# Patient Record
Sex: Male | Born: 1960 | Race: Black or African American | Hispanic: No | Marital: Single | Smoking: Never smoker
Health system: Southern US, Community
[De-identification: ages and names within clinical notes are randomized; demographics above are authoritative.]

## PROBLEM LIST (undated history)

## (undated) DIAGNOSIS — I499 Cardiac arrhythmia, unspecified: Secondary | ICD-10-CM

## (undated) DIAGNOSIS — R3129 Other microscopic hematuria: Secondary | ICD-10-CM

## (undated) DIAGNOSIS — N529 Male erectile dysfunction, unspecified: Secondary | ICD-10-CM

## (undated) DIAGNOSIS — I10 Essential (primary) hypertension: Secondary | ICD-10-CM

## (undated) DIAGNOSIS — M459 Ankylosing spondylitis of unspecified sites in spine: Secondary | ICD-10-CM

## (undated) HISTORY — DX: Ankylosing spondylitis of unspecified sites in spine: M45.9

## (undated) HISTORY — DX: Male erectile dysfunction, unspecified: N52.9

## (undated) HISTORY — DX: Other microscopic hematuria: R31.29

## (undated) HISTORY — DX: Essential (primary) hypertension: I10

---

## 1987-03-07 HISTORY — PX: LYMPH GLAND EXCISION: SHX13

## 2002-06-18 ENCOUNTER — Emergency Department (HOSPITAL_COMMUNITY): Admission: EM | Admit: 2002-06-18 | Discharge: 2002-06-18 | Payer: Self-pay | Admitting: Emergency Medicine

## 2003-04-21 ENCOUNTER — Encounter: Admission: RE | Admit: 2003-04-21 | Discharge: 2003-04-21 | Payer: Self-pay | Admitting: Family Medicine

## 2003-09-02 ENCOUNTER — Encounter: Admission: RE | Admit: 2003-09-02 | Discharge: 2003-09-02 | Payer: Self-pay | Admitting: Internal Medicine

## 2003-12-16 ENCOUNTER — Observation Stay (HOSPITAL_COMMUNITY): Admission: EM | Admit: 2003-12-16 | Discharge: 2003-12-16 | Payer: Self-pay | Admitting: Emergency Medicine

## 2005-10-26 ENCOUNTER — Ambulatory Visit: Payer: Self-pay | Admitting: Family Medicine

## 2005-11-27 ENCOUNTER — Ambulatory Visit: Payer: Self-pay | Admitting: Family Medicine

## 2006-04-02 ENCOUNTER — Ambulatory Visit: Payer: Self-pay | Admitting: Family Medicine

## 2006-08-08 ENCOUNTER — Ambulatory Visit: Payer: Self-pay | Admitting: Family Medicine

## 2007-03-20 ENCOUNTER — Ambulatory Visit: Payer: Self-pay | Admitting: Family Medicine

## 2007-04-19 ENCOUNTER — Ambulatory Visit: Payer: Self-pay | Admitting: Family Medicine

## 2007-09-05 ENCOUNTER — Ambulatory Visit: Payer: Self-pay | Admitting: Family Medicine

## 2007-09-12 ENCOUNTER — Ambulatory Visit: Payer: Self-pay | Admitting: Family Medicine

## 2009-04-22 ENCOUNTER — Ambulatory Visit: Payer: Self-pay | Admitting: Family Medicine

## 2010-01-31 ENCOUNTER — Ambulatory Visit: Payer: Self-pay | Admitting: Family Medicine

## 2010-05-19 ENCOUNTER — Ambulatory Visit (INDEPENDENT_AMBULATORY_CARE_PROVIDER_SITE_OTHER): Payer: BC Managed Care – PPO | Admitting: Family Medicine

## 2010-05-19 DIAGNOSIS — J209 Acute bronchitis, unspecified: Secondary | ICD-10-CM

## 2010-07-22 NOTE — H&P (Signed)
NAMEAADYN, Earl Sanchez                ACCOUNT NO.:  [id number]   MEDICAL RECORD NO.:  [id number]          PATIENT TYPE:  EMS   LOCATION:  MAJO                         FACILITY:  [facility]   PHYSICIAN:  Quita Skye. Collman, MDDATE OF BIRTH:  January 13, 1961   DATE OF ADMISSION:  12/15/2003  DATE OF DISCHARGE:                                HISTORY & PHYSICAL   HISTORY OF PRESENT ILLNESS:  The patient is a 50 year old black man who is  admitted to [facility] for further evaluation of chest pain.   The patient, who has no past history of cardiac disease, presented to the  emergency department after experiencing two episodes of chest pain this  evening.  The first episode lasted for one hour and resolved spontaneously.  The second episode lasted for approximately 10 minutes; it too resolved  spontaneously.  The episodes occurred as he was preparing to go to bed.  The  chest pain was described as an upper substernal pressure.  It radiated to  his jaw bilaterally.  There was no associated dyspnea, diaphoresis, or  nausea.  There were no exacerbating or ameliorating factors.  It appeared  not to be related to position, activity, meals, or respiration.  The patient  experienced a similar episode last week which also lasted an hour.  He  apparently came to the emergency department, but left before he was seen.  He had no other episodes of chest pain.  The patient is asymptomatic and  otherwise feels well at this time.   As noted, the patient had no past history of cardiac disease, including no  history of chest pain, myocardial infarction, coronary artery disease,  congestive heart failure, or arrhythmias.  He has a history of hypertension  and is on medications.  There is no history of diabetes mellitus,  dyslipidemia, smoking, or family history of early coronary artery disease.   In addition to the hypertension, the patient has no other medical problems  whatsoever.   PAST SURGICAL  HISTORY:  None.   PAST MEDICAL HISTORY:  Significant injuries; none.   MEDICATIONS:  Diltiazem.   ALLERGIES:  No known drug allergies.   SOCIAL HISTORY:  The patient lives with his wife.  He is employed in [employer].  He drinks rarely. He does not smoke.   REVIEW OF SYSTEMS:  No new problems related to his head, eyes, ears, nose,  mouth, throat, lungs, gastrointestinal system, genitourinary system, or  extremities.  There is no history of neurologic or psychiatric disorder.  There is no history of fever, chills, or weight loss.   PHYSICAL EXAMINATION:  VITAL SIGNS:  Blood pressure 180/99, pulse 89 and  regular, respirations 19, temperature 97.4.  GENERAL:  The patient is a middle-aged black man in no discomfort.  He was  alert, oriented, appropriate, and responsive.  HEENT:  Normal.  NECK:  Without thyromegaly or adenopathy.  Carotid pulses were palpable  bilaterally.  Bilateral carotid bruits were auscultated; the left bruit was  louder than the right.  HEART:  Normal S1 and S2.  There was no S3, S4,  murmur, rub, or click.  Cardiac rhythm is regular.  No chest wall tenderness was noted.  LUNGS:  Clear.  ABDOMEN:  Soft and nontender.  There was no mass, hepatosplenomegaly, bruit,  distention, rebound, guarding, or rigidity.  Bowel sounds were normal.  RECTAL:  GENITOURINARY:  Not performed as they were not pertinent to the  reason for acute care hospitalization.  EXTREMITIES:  Without edema, deviation, or deformity.  Radial and dorsalis  pedal pulses were palpable bilaterally.  Deep screening neurologic survey  was unremarkable.   The electrocardiogram revealed normal sinus rhythm.  There was left  ventricular hypertrophy with minimal secondary repolarization abnormalities.  There was a Q wave in V2 and minimal R wave in V3, suggesting a possible  prior anteroseptal myocardial infarction.  The chest radiograph, according  to the radiologist, demonstrated no evidence of acute  abnormalities.   LABORATORY DATA:  White count was 7.4 with a hemoglobin of 14.9 and  hematocrit of 42.6.  All other laboratory studies were pending at the time  of this dictation.   IMPRESSION:  1.  Unstable angina.  The patient's history is consistent with unstable      angina.  2.  Hypertension.  3.  Bilateral carotid bruits, louder on the left than on the right.   PLAN:  1.  Telemetry.  2.  Serial cardiac enzymes.  3.  Aspirin.  4.  Intravenous heparin.  5.  Intravenous nitroglycerin.  6.  Metoprolol.  7.  Fasting lipid profile.  8.  Carotid Dopplers.  9.  Further measures per Richard A. Alanda Amass, M.D.       MSC/MEDQ  D:  12/16/2003  T:  12/16/2003  Job:  [id number]   cc:   Gerlene Burdock A. Alanda Amass, M.D.  [phone] [address]  [place]  [place] [address]  Fax: [phone]

## 2010-07-22 NOTE — Cardiovascular Report (Signed)
NAMEBEAUMONT, AUSTAD                ACCOUNT NO.:  [id number]   MEDICAL RECORD NO.:  [id number]          PATIENT TYPE:  OBV   LOCATION:  6532                         FACILITY:  [facility]   PHYSICIAN:  Darlin Priestly, MD  DATE OF BIRTH:  [birth year]   DATE OF PROCEDURE:  12/16/2003  DATE OF DISCHARGE:                              CARDIAC CATHETERIZATION   PROCEDURES:  1.  Left heart catheterization.  2.  Coronary angiography.  3.  Left ventriculogram.  4.  Abdominal aortogram.   COMPLICATIONS:  None.   INDICATIONS:  Mr. Chanthavong is a 50 year old male admitted on December 15, 2003,  with recurrent chest pain on and off for one week.  The pain radiated to  neck and jaw.  He does have a history of hypertension, but no known coronary  disease.  He is now referred for cardiac catheterization secondary to  ongoing chest pain.   DESCRIPTION OF PROCEDURE:  After giving informed written consent, the  patient was brought to the cardiac catheterization lab.  Right groin was  shaved, prepped and draped in usual sterile fashion.  ECG moderately  established.  Using modified Seldinger technique, a #6 French arterial  sheath inserted in the right femoral artery.  A 6-French diagnostic catheter  was used to perform diagnostic angiography.   Left main is the large vessel with no significant disease.   The LAD is a large vessel which courses around the apex.  There were three  diagonal branches.  LAD had no significant disease.   First and second diagonal are small vessels.  No significant disease.   Third diagonal is a medium size vessel which bifurcates distally with no  significant disease.   Left coronary artery gives rise to a large ramus intermedius which  bifurcates distally.  There is no significant disease in the ramus.   Left circumflex is a large vessel which courses in the AV groove and gives  rise to two obtuse marginal branches.  There is no significant disease in  the AV groove  circumflex.  First and second OMs were medium sized vessels  with no significant disease.   The right coronary artery is a large vessel that is dominant and gives rise  to both PDA's and posterolateral branch.  There is no significant disease in  the RCA, PDA or posterolateral branch.   Left ventriculogram was super normal EF at 80% with near mid cavity  obliteration.   Abdominal aortogram revealed no evidence of renal artery stenosis.   HEMODYNAMIC DATA:  Systemic arterial pressure 145/91, LV pressure 152/7,  LVDP of 11.   CONCLUSION:  1.  No significant coronary artery disease.  2.  Normal left ventricular systolic function with near mid cavity      obliteration.  3.  No renal artery stenosis.  4.  Systemic hypertension.      Robe   RHM/MEDQ  D:  12/16/2003  T:  12/16/2003  Job:  [id number]

## 2010-08-10 ENCOUNTER — Other Ambulatory Visit: Payer: Self-pay | Admitting: Family Medicine

## 2010-08-10 NOTE — Telephone Encounter (Signed)
Please review refill req Cialis

## 2010-08-31 ENCOUNTER — Ambulatory Visit (INDEPENDENT_AMBULATORY_CARE_PROVIDER_SITE_OTHER): Payer: BC Managed Care – PPO | Admitting: Medical

## 2010-08-31 ENCOUNTER — Encounter: Payer: Self-pay | Admitting: Medical

## 2010-08-31 VITALS — BP 130/88 | HR 68 | Temp 98.1°F | Ht 68.0 in | Wt 163.0 lb

## 2010-08-31 DIAGNOSIS — A09 Infectious gastroenteritis and colitis, unspecified: Secondary | ICD-10-CM

## 2010-08-31 MED ORDER — CIPROFLOXACIN HCL 500 MG PO TABS: 500.0000 mg | ORAL_TABLET | Freq: Two times a day (BID) | ORAL | Status: AC

## 2010-08-31 NOTE — Progress Notes (Signed)
Subjective:   HPI  Earl Sanchez is a 50 y.o. male who presents for c/o illness. He notes for the past 2-3 weeks having intermittent abdominal pain, loose stools/diarrhea, 4+/wk, decreased appetite, flatulence, and nausea.  The diarrhea and abdominal pain worsen after eating.  The stool has been light colored.  He denies sick contacts with similar.  He has recently been out of the country in [place], and over [place] day weekend he had eaten at a cookout where the food had been left out at room temperature for several hours.  Otherwise he has been in his normal state of health.    Of note, he had sex with a new partner in [place] and used a condom, but the condom broke.  He has a physical coming up in 2 weeks, and wants to have STD testing then.  He has not current urinary or genital symptoms suggestive of STD and declines symptoms today.  The following portions of the patient's history were reviewed and updated as appropriate: allergies, current medications, past family history, past medical history, past social history, past surgical history and problem list.  Past Medical History  Diagnosis Date  . Hypertension   . Ankylosing spondylitis   . Microscopic hematuria   . Erectile dysfunction     Review of Systems Constitutional: +fatigue, anorexia, weight loss; denies fever, chills, sweats Dermatology: denies rash ENT: no runny nose, ear pain, sore throat, hoarseness, sinus pain Cardiology: denies chest pain, palpitations, edema Respiratory: denies cough, shortness of breath, wheezing Gastroenterology: denies constipation, blood in stool Hematology: denies bleeding or bruising problems Musculoskeletal: denies arthralgias, myalgias, joint swelling, back pain Urology: denies dysuria, difficulty urinating, hematuria, urinary frequency, urgency     Objective:   Physical Exam  General appearance: alert, no distress, WD/WN, black male Skin : warm, dry Oral cavity: MMM, no lesions Neck:  supple, no lymphadenopathy, no thyromegaly, no masses Heart: RRR, normal S1, S2, no murmurs Lungs: CTA bilaterally, no wheezes, rhonchi, or rales Abdomen: +bs, soft, mild left side tenderness, non distended, no masses, no hepatomegaly, no splenomegaly Back: non tender Musculoskeletal: nontender, no swelling, no obvious deformity Extremities: no edema, no cyanosis, no clubbing Pulses: 2+ symmetric, upper and lower extremities, normal cap refill   Assessment :    Encounter Diagnosis  Name Primary?  . Traveler's diarrhea Yes     Plan:    Discussed symptoms, and given the recent exposures including travel and possible food contamination, will treat with 3-5 days of Cipro.  Call report on Friday, and if worse or not improving, call or return.

## 2010-09-05 ENCOUNTER — Encounter: Payer: Self-pay | Admitting: Family Medicine

## 2010-09-06 ENCOUNTER — Telehealth: Payer: Self-pay | Admitting: *Deleted

## 2010-09-06 NOTE — Telephone Encounter (Addendum)
Message copied by Dorthula Perfect on Tue Sep 06, 2010  3:03 PM ------      Message from: Jac Canavan      Created: Tue Sep 06, 2010  1:54 PM       msg called in that he is requesting refill on diarrhea medication.  I assume he means the Cipro?  If this is correct, what are his symptoms?   I had told him that if the symptoms and diarrhea didn't resolve, then we should check some labs including urine, CBC, stool studies, CMP, etc.  See what symptoms he is having?  Called pt and still having same symptoms but not as bad.  Would like rx for Cipro.  Antibiotic seems to be working really well.  What would you like to do?  CM, LPN   Called in Cipro and informed pt that rx was sent. Pt has an appointment on 09-12-10 with Dr. Susann Givens.  CM, LPN

## 2010-09-06 NOTE — Telephone Encounter (Signed)
Called in Cipro 500 mg x 5 days #10 with no refills to [facility] at [phone].  Pt aware.  CM, LPN

## 2010-09-06 NOTE — Telephone Encounter (Signed)
Returned call to pt and would like rx for Cipro.  Still has same symptoms but not as bad.  Pt is scheduled to come in on 09-12-10 to see Dr. Susann Givens.  CM,LPN

## 2010-09-06 NOTE — Telephone Encounter (Deleted)
Message copied by Dorthula Perfect on Tue Sep 06, 2010  3:02 PM ------      Message from: Jac Canavan      Created: Tue Sep 06, 2010  1:54 PM       msg called in that he is requesting refill on diarrhea medication.  I assume he means the Cipro?  If this is correct, what are his symptoms?   I had told him that if the symptoms and diarrhea didn't resolve, then we should check some labs including urine, CBC, stool studies, CMP, etc.  See what symptoms he is having?

## 2010-09-06 NOTE — Telephone Encounter (Signed)
Call out Cipro 500mg  BID x 5 days #10, no refill.  If this doesn't resolve things, then come in for recheck.

## 2010-09-12 ENCOUNTER — Ambulatory Visit (INDEPENDENT_AMBULATORY_CARE_PROVIDER_SITE_OTHER): Payer: BC Managed Care – PPO | Admitting: Family Medicine

## 2010-09-12 ENCOUNTER — Encounter: Payer: Self-pay | Admitting: Family Medicine

## 2010-09-12 VITALS — BP 132/88 | HR 68 | Ht 68.0 in | Wt 161.0 lb

## 2010-09-12 DIAGNOSIS — Z0000 Encounter for general adult medical examination without abnormal findings: Secondary | ICD-10-CM

## 2010-09-12 DIAGNOSIS — M459 Ankylosing spondylitis of unspecified sites in spine: Secondary | ICD-10-CM

## 2010-09-12 DIAGNOSIS — R319 Hematuria, unspecified: Secondary | ICD-10-CM | POA: Insufficient documentation

## 2010-09-12 DIAGNOSIS — H20029 Recurrent acute iridocyclitis, unspecified eye: Secondary | ICD-10-CM

## 2010-09-12 DIAGNOSIS — Z209 Contact with and (suspected) exposure to unspecified communicable disease: Secondary | ICD-10-CM

## 2010-09-12 DIAGNOSIS — I10 Essential (primary) hypertension: Secondary | ICD-10-CM

## 2010-09-12 DIAGNOSIS — N529 Male erectile dysfunction, unspecified: Secondary | ICD-10-CM

## 2010-09-12 LAB — CBC WITH DIFFERENTIAL/PLATELET
Basophils Absolute: 0 10*3/uL (ref 0.0–0.1)
Basophils Relative: 0 % (ref 0–1)
Eosinophils Absolute: 0.1 10*3/uL (ref 0.0–0.7)
Eosinophils Relative: 1 % (ref 0–5)
HCT: 45.5 % (ref 39.0–52.0)
Hemoglobin: 15.3 g/dL (ref 13.0–17.0)
Lymphocytes Relative: 35 % (ref 12–46)
Lymphs Abs: 1.7 10*3/uL (ref 0.7–4.0)
MCH: 29.3 pg (ref 26.0–34.0)
MCHC: 33.6 g/dL (ref 30.0–36.0)
MCV: 87 fL (ref 78.0–100.0)
Monocytes Absolute: 0.5 10*3/uL (ref 0.1–1.0)
Monocytes Relative: 10 % (ref 3–12)
Neutro Abs: 2.7 10*3/uL (ref 1.7–7.7)
Neutrophils Relative %: 54 % (ref 43–77)
Platelets: 281 10*3/uL (ref 150–400)
RBC: 5.23 MIL/uL (ref 4.22–5.81)
RDW: 14.9 % (ref 11.5–15.5)
WBC: 5 10*3/uL (ref 4.0–10.5)

## 2010-09-12 LAB — LIPID PANEL
Cholesterol: 199 mg/dL (ref 0–200)
HDL: 77 mg/dL (ref >39)
LDL Cholesterol: 109 mg/dL — ABNORMAL HIGH (ref 0–99)
Total CHOL/HDL Ratio: 2.6 Ratio
Triglycerides: 66 mg/dL (ref <150)
VLDL: 13 mg/dL (ref 0–40)

## 2010-09-12 LAB — COMPREHENSIVE METABOLIC PANEL
ALT: 16 U/L (ref 0–53)
AST: 26 U/L (ref 0–37)
Albumin: 4.7 g/dL (ref 3.5–5.2)
Alkaline Phosphatase: 84 U/L (ref 39–117)
BUN: 9 mg/dL (ref 6–23)
CO2: 24 mEq/L (ref 19–32)
Calcium: 9.7 mg/dL (ref 8.4–10.5)
Chloride: 106 mEq/L (ref 96–112)
Creat: 0.93 mg/dL (ref 0.50–1.35)
Glucose, Bld: 94 mg/dL (ref 70–99)
Potassium: 4.1 mEq/L (ref 3.5–5.3)
Sodium: 142 mEq/L (ref 135–145)
Total Bilirubin: 0.8 mg/dL (ref 0.3–1.2)
Total Protein: 7.6 g/dL (ref 6.0–8.3)

## 2010-09-12 LAB — RPR

## 2010-09-12 NOTE — Progress Notes (Signed)
  Subjective:    Patient ID: Earl Sanchez, male    DOB: 1960-05-06, 50 y.o.   MRN: [id number]  HPI He is here for a complete examination. He has had intermittent difficulty with iritis and is seeing an ophthalmologist for this. His abdominal distress is much better. He did have a recent sexual encounter and the condom broke. He is having no discharge or dysuria. His review of systems is essentially negative. He does drink but does not smoke. He is sexually active as mentioned above with women. Family history is unchanged.   Review of Systems  Constitutional: Negative.   HENT: Negative.   Eyes: Positive for pain.  Respiratory: Negative.   Cardiovascular: Negative.   Gastrointestinal: Negative.   Musculoskeletal: Negative.   Skin: Negative.        Objective:   Physical Exam BP 132/88  Pulse 68  Ht 5\' 8"  (1.727 m)  Wt 161 lb (73.029 kg)  BMI 24.48 kg/m2  General Appearance:    Alert, cooperative, no distress, appears stated age  Head:    Normocephalic, without obvious abnormality, atraumatic  Eyes:    PERRL, conjunctiva/corneas clear, EOM's intact, fundi    benign  Ears:    Normal TM's and external ear canals  Nose:   Nares normal, mucosa normal, no drainage or sinus   tenderness  Throat:   Lips, mucosa, and tongue normal; teeth and gums normal  Neck:   Supple, no lymphadenopathy;  thyroid:  no   enlargement/tenderness/nodules; no carotid   bruit or JVD  Back:    Spine nontender, no curvature, ROM normal, no CVA     tenderness  Lungs:     Clear to auscultation bilaterally without wheezes, rales or     ronchi; respirations unlabored  Chest Wall:    No tenderness or deformity   Heart:    Regular rate and rhythm, S1 and S2 normal, no murmur, rub   or gallop  Breast Exam:    No chest wall tenderness, masses or gynecomastia  Abdomen:     Soft, non-tender, nondistended, normoactive bowel sounds,    no masses, no hepatosplenomegaly  Genitalia:    Normal male external genitalia  without lesions.  Testicles without masses.  No inguinal hernias.      Extremities:   No clubbing, cyanosis or edema  Pulses:   2+ and symmetric all extremities  Skin:   Skin color, texture, turgor normal, no rashes or lesions  Lymph nodes:   Cervical, supraclavicular, and axillary nodes normal  Neurologic:   CNII-XII intact, normal strength, sensation and gait; reflexes 2+ and symmetric throughout          Psych:   Normal mood, affect, hygiene and grooming.           Assessment & Plan:   Routine blood screening including HIV, RPR as well as GC Chlamydia. He will be set up for colonoscopy.

## 2010-09-12 NOTE — Patient Instructions (Signed)
I will call you tomorrow with the results of the blood work 

## 2010-09-13 LAB — GC/CHLAMYDIA PROBE AMP, GENITAL
Chlamydia, DNA Probe: NEGATIVE
GC Probe Amp, Genital: NEGATIVE

## 2010-09-13 LAB — HIV ANTIBODY (ROUTINE TESTING W REFLEX): HIV: NONREACTIVE

## 2010-09-14 ENCOUNTER — Telehealth: Payer: Self-pay

## 2010-09-14 NOTE — Telephone Encounter (Signed)
Called pt to let him know labs normal left message labs were normal any ? He can give me a call

## 2010-10-25 ENCOUNTER — Other Ambulatory Visit: Payer: Self-pay | Admitting: Family Medicine

## 2011-02-08 ENCOUNTER — Encounter (HOSPITAL_COMMUNITY): Payer: Self-pay | Admitting: Emergency Medicine

## 2011-02-08 ENCOUNTER — Emergency Department (HOSPITAL_COMMUNITY)
Admission: EM | Admit: 2011-02-08 | Discharge: 2011-02-09 | Discharge status: Against Medical Advice | Payer: BC Managed Care – PPO | Attending: Emergency Medicine | Admitting: Emergency Medicine

## 2011-02-08 DIAGNOSIS — R319 Hematuria, unspecified: Secondary | ICD-10-CM | POA: Insufficient documentation

## 2011-02-08 LAB — URINALYSIS, ROUTINE W REFLEX MICROSCOPIC
Bilirubin Urine: NEGATIVE
Glucose, UA: NEGATIVE mg/dL
Ketones, ur: NEGATIVE mg/dL
Leukocytes, UA: NEGATIVE
Nitrite: NEGATIVE
Protein, ur: NEGATIVE mg/dL
Specific Gravity, Urine: 1.006 (ref 1.005–1.030)
Urobilinogen, UA: 0.2 mg/dL (ref 0.0–1.0)
pH: 6 (ref 5.0–8.0)

## 2011-02-08 LAB — URINE MICROSCOPIC-ADD ON

## 2011-02-08 NOTE — ED Notes (Signed)
Pt c/o hematuria and passing clots in urine, onset earlier today with no complaints of pain

## 2011-02-08 NOTE — ED Notes (Signed)
Call to waiting area no reply at this time. 

## 2011-02-09 ENCOUNTER — Telehealth: Payer: Self-pay | Admitting: Family Medicine

## 2011-02-09 NOTE — Telephone Encounter (Signed)
[facility] RE ER VISIT FOLLOW UP   DT

## 2011-02-20 ENCOUNTER — Encounter: Payer: Self-pay | Admitting: Family Medicine

## 2011-02-20 ENCOUNTER — Ambulatory Visit (INDEPENDENT_AMBULATORY_CARE_PROVIDER_SITE_OTHER): Payer: BC Managed Care – PPO | Admitting: Family Medicine

## 2011-02-20 DIAGNOSIS — R319 Hematuria, unspecified: Secondary | ICD-10-CM

## 2011-02-20 DIAGNOSIS — Z23 Encounter for immunization: Secondary | ICD-10-CM

## 2011-02-20 DIAGNOSIS — N419 Inflammatory disease of prostate, unspecified: Secondary | ICD-10-CM

## 2011-02-20 DIAGNOSIS — Z1211 Encounter for screening for malignant neoplasm of colon: Secondary | ICD-10-CM

## 2011-02-20 LAB — POCT URINALYSIS DIPSTICK
Bilirubin, UA: NEGATIVE
Blood, UA: 250
Glucose, UA: NEGATIVE
Ketones, UA: NEGATIVE
Leukocytes, UA: NEGATIVE
Nitrite, UA: NEGATIVE
Protein, UA: NEGATIVE
Spec Grav, UA: 1.025
Urobilinogen, UA: NEGATIVE
pH, UA: 7.5

## 2011-02-20 LAB — POC HEMOCCULT BLD/STL (OFFICE/1-CARD/DIAGNOSTIC): Fecal Occult Blood, POC: NEGATIVE

## 2011-02-20 MED ORDER — CIPROFLOXACIN HCL 500 MG PO TABS: 500.0000 mg | ORAL_TABLET | Freq: Two times a day (BID) | ORAL | Status: AC

## 2011-02-20 NOTE — Patient Instructions (Signed)
Take all the antibiotic and  I will see you back here in 2 weeks

## 2011-02-20 NOTE — Progress Notes (Signed)
  Subjective:    Patient ID: Earl Sanchez, male    DOB: 10-22-1960, 50 y.o.   MRN: [id number]  HPI He notes difficulty with blood in his urine mainly with erections and with ejaculation. He has had no burning, stinging or discharge.   Review of Systems     Objective:   Physical Exam        Assessment & Plan:

## 2011-03-08 ENCOUNTER — Encounter: Payer: Self-pay | Admitting: Family Medicine

## 2011-03-08 ENCOUNTER — Ambulatory Visit (INDEPENDENT_AMBULATORY_CARE_PROVIDER_SITE_OTHER): Payer: BC Managed Care – PPO | Admitting: Family Medicine

## 2011-03-08 DIAGNOSIS — R319 Hematuria, unspecified: Secondary | ICD-10-CM

## 2011-03-08 LAB — POCT URINALYSIS DIPSTICK
Bilirubin, UA: NEGATIVE
Blood, UA: 50
Glucose, UA: NEGATIVE
Ketones, UA: NEGATIVE
Leukocytes, UA: NEGATIVE
Spec Grav, UA: 1.02
Urobilinogen, UA: NEGATIVE

## 2011-03-08 NOTE — Patient Instructions (Signed)
If you have difficulty with bleeding again, back in for repeat urine check and possible referral

## 2011-03-08 NOTE — Progress Notes (Signed)
  Subjective:    Patient ID: Earl Sanchez, male    DOB: Feb 15, 1961, 51 y.o.   MRN: [id number]  HPI He is here for reevaluation of seeing blood in his semen. Since his last visit he has had 2 episodes of seeing well otherwise he has had no trouble.   Review of Systems     Objective:   Physical Exam Alert and in no distress. Urine microscopic was negative      Assessment & Plan:  Hematuria probably secondary to prostatitis. No further therapy however if he has another episode, possible referral will be made.

## 2011-05-15 ENCOUNTER — Encounter: Payer: Self-pay | Admitting: Family Medicine

## 2011-05-15 ENCOUNTER — Encounter: Payer: Self-pay | Admitting: Gastroenterology

## 2011-05-15 ENCOUNTER — Ambulatory Visit (INDEPENDENT_AMBULATORY_CARE_PROVIDER_SITE_OTHER): Payer: BC Managed Care – PPO | Admitting: Family Medicine

## 2011-05-15 VITALS — BP 132/90 | HR 72 | Wt 168.0 lb

## 2011-05-15 DIAGNOSIS — R195 Other fecal abnormalities: Secondary | ICD-10-CM

## 2011-05-15 DIAGNOSIS — N410 Acute prostatitis: Secondary | ICD-10-CM

## 2011-05-15 DIAGNOSIS — N433 Hydrocele, unspecified: Secondary | ICD-10-CM

## 2011-05-15 DIAGNOSIS — Z209 Contact with and (suspected) exposure to unspecified communicable disease: Secondary | ICD-10-CM

## 2011-05-15 DIAGNOSIS — R350 Frequency of micturition: Secondary | ICD-10-CM

## 2011-05-15 DIAGNOSIS — N342 Other urethritis: Secondary | ICD-10-CM

## 2011-05-15 LAB — POCT URINALYSIS DIPSTICK
Bilirubin, UA: NEGATIVE
Glucose, UA: NEGATIVE
Ketones, UA: NEGATIVE
Leukocytes, UA: NEGATIVE
Nitrite, UA: NEGATIVE
Protein, UA: NEGATIVE
Spec Grav, UA: 1.01
Urobilinogen, UA: NEGATIVE
pH, UA: 6

## 2011-05-15 LAB — HEMOCCULT GUIAC POC 1CARD (OFFICE)

## 2011-05-15 MED ORDER — DOXYCYCLINE HYCLATE 100 MG PO TABS: 100.0000 mg | ORAL_TABLET | Freq: Two times a day (BID) | ORAL | Status: DC

## 2011-05-15 NOTE — Patient Instructions (Signed)
If you have continued difficulty with blood in your urine, you're to return here for

## 2011-05-15 NOTE — Progress Notes (Signed)
  Subjective:    Patient ID: Earl Sanchez, male    DOB: [birth year], 51 y.o.   MRN: [id number]  HPI Approximately 5 days ago he noted the onset of burning on urination but no discharge and difficulty with urgency. Approximately one week prior to that he did have sex and the condom broke. He also notes continued difficulty with right-sided hydrocele that he says is slightly larger.   Review of Systems     Objective:   Physical Exam Genital exam does show a 2 cm transilluminating lesion proximal to the right testes. No other lesions are noted. No discharge noted. Rectal exam shows a slightly boggy prostate. Goal is guaiac-positive Urine microscopic was negative.       Assessment & Plan:   1. Urination frequency  POCT Urinalysis Dipstick  2. Hydrocele, right    3. Stool guaiac positive  HM COLONOSCOPY, Hemoccult - 1 Card (office)  4. Prostatitis, acute  doxycycline (VIBRA-TABS) 100 MG tablet  5. Urethritis  doxycycline (VIBRA-TABS) 100 MG tablet  6. Contact with or exposure to unspecified communicable disease  GC/chlamydia probe amp, urine   I will treat him with doxycycline. No therapy needed for the hydrocele since it is not really causing any difficulty. Will also be set up for colonoscopy. This was supposed up and set up in the past however apparently he failed to followup on this.

## 2011-05-16 LAB — GC/CHLAMYDIA PROBE AMP, URINE
Chlamydia, Swab/Urine, PCR: NEGATIVE
GC Probe Amp, Urine: NEGATIVE

## 2011-05-22 ENCOUNTER — Ambulatory Visit (AMBULATORY_SURGERY_CENTER): Payer: BC Managed Care – PPO | Admitting: *Deleted

## 2011-05-22 ENCOUNTER — Encounter: Payer: Self-pay | Admitting: Gastroenterology

## 2011-05-22 VITALS — Ht 68.0 in | Wt 168.0 lb

## 2011-05-22 DIAGNOSIS — K921 Melena: Secondary | ICD-10-CM

## 2011-05-22 DIAGNOSIS — Z1211 Encounter for screening for malignant neoplasm of colon: Secondary | ICD-10-CM

## 2011-05-22 MED ORDER — PEG-KCL-NACL-NASULF-NA ASC-C 100 G PO SOLR: ORAL | Status: DC

## 2011-06-01 ENCOUNTER — Other Ambulatory Visit: Payer: Self-pay | Admitting: Family Medicine

## 2011-06-01 ENCOUNTER — Telehealth: Payer: Self-pay

## 2011-06-01 MED ORDER — CIPROFLOXACIN HCL 500 MG PO TABS: 500.0000 mg | ORAL_TABLET | Freq: Two times a day (BID) | ORAL | Status: AC

## 2011-06-01 NOTE — Telephone Encounter (Signed)
Called patient to let him know that Dr.Knapp ok'd cipro and it was sent to his pharmacy. He is to follow up next week if not better.

## 2011-06-01 NOTE — Telephone Encounter (Signed)
Pt called and said he can't come in today and said he finished the Doxy and is still having same problem with the burning with urination ask if we could call in some Cipro for him please advise

## 2011-06-01 NOTE — Telephone Encounter (Signed)
Okay for cipro, treating for possible prostatitis (not responsive to doxycycline)--advise him rx was sent to pharmacy.  F/u next week if not better.

## 2011-06-05 ENCOUNTER — Encounter: Payer: Self-pay | Admitting: Gastroenterology

## 2011-06-05 ENCOUNTER — Ambulatory Visit (AMBULATORY_SURGERY_CENTER): Payer: BC Managed Care – PPO | Admitting: Gastroenterology

## 2011-06-05 VITALS — BP 155/92 | HR 67 | Temp 97.6°F | Resp 20 | Ht 68.0 in | Wt 168.0 lb

## 2011-06-05 DIAGNOSIS — Z1211 Encounter for screening for malignant neoplasm of colon: Secondary | ICD-10-CM

## 2011-06-05 DIAGNOSIS — K5730 Diverticulosis of large intestine without perforation or abscess without bleeding: Secondary | ICD-10-CM

## 2011-06-05 DIAGNOSIS — D126 Benign neoplasm of colon, unspecified: Secondary | ICD-10-CM

## 2011-06-05 MED ORDER — SODIUM CHLORIDE 0.9 % IV SOLN: 500.0000 mL | INTRAVENOUS | Status: DC

## 2011-06-05 NOTE — Patient Instructions (Addendum)
YOU HAD AN ENDOSCOPIC PROCEDURE TODAY AT THE [facility]: Refer to the procedure report that was given to you for any specific questions about what was found during the examination.  If the procedure report does not answer your questions, please call your gastroenterologist to clarify.  If you requested that your care partner not be given the details of your procedure findings, then the procedure report has been included in a sealed envelope for you to review at your convenience later.  YOU SHOULD EXPECT: Some feelings of bloating in the abdomen. Passage of more gas than usual.  Walking can help get rid of the air that was put into your GI tract during the procedure and reduce the bloating. If you had a lower endoscopy (such as a colonoscopy or flexible sigmoidoscopy) you may notice spotting of blood in your stool or on the toilet paper. If you underwent a bowel prep for your procedure, then you may not have a normal bowel movement for a few days.  DIET: Your first meal following the procedure should be a light meal and then it is ok to progress to your normal diet.  A half-sandwich or bowl of soup is an example of a good first meal.  Heavy or fried foods are harder to digest and may make you feel nauseous or bloated.  Likewise meals heavy in dairy and vegetables can cause extra gas to form and this can also increase the bloating.  Drink plenty of fluids but you should avoid alcoholic beverages for 24 hours.  ACTIVITY: Your care partner should take you home directly after the procedure.  You should plan to take it easy, moving slowly for the rest of the day.  You can resume normal activity the day after the procedure however you should NOT DRIVE or use heavy machinery for 24 hours (because of the sedation medicines used during the test).    SYMPTOMS TO REPORT IMMEDIATELY: A gastroenterologist can be reached at any hour.  During normal business hours, 8:30 AM to 5:00 PM Monday through Friday,  call [phone].  After hours and on weekends, please call the GI answering service at [phone] who will take a message and have the physician on call contact you.   Following lower endoscopy (colonoscopy or flexible sigmoidoscopy):  Excessive amounts of blood in the stool  Significant tenderness or worsening of abdominal pains  Swelling of the abdomen that is new, acute  Fever of 100F or higher    FOLLOW UP: If any biopsies were taken you will be contacted by phone or by letter within the next 1-3 weeks.  Call your gastroenterologist if you have not heard about the biopsies in 3 weeks.  Our staff will call the home number listed on your records the next business day following your procedure to check on you and address any questions or concerns that you may have at that time regarding the information given to you following your procedure. This is a courtesy call and so if there is no answer at the home number and we have not heard from you through the emergency physician on call, we will assume that you have returned to your regular daily activities without incident.  SIGNATURES/CONFIDENTIALITY: You and/or your care partner have signed paperwork which will be entered into your electronic medical record.  These signatures attest to the fact that that the information above on your After Visit Summary has been reviewed and is understood.  Full responsibility of the confidentiality   of this discharge information lies with you and/or your care-partner.   INFORMATION ON POLYPS, DIVERTICULOSIS , & HIGH FIBER DIET GIVEN TO YOU TODAY.   NO ASPIRIN OR ANTI INFLAMMATORY MEDICATIONS X 2 WEEKS

## 2011-06-05 NOTE — Progress Notes (Signed)
Patient did not experience any of the following events: a burn prior to discharge; a fall within the facility; wrong site/side/patient/procedure/implant event; or a hospital transfer or hospital admission upon discharge from the facility. (G8907) Patient did not have preoperative order for IV antibiotic SSI prophylaxis. (G8918)  

## 2011-06-05 NOTE — Op Note (Signed)
[facility] [address] [place], [place]  [address]  COLONOSCOPY PROCEDURE REPORT  PATIENT:  Earl, Sanchez  MR#:  [id number] BIRTHDATE:  March 27, 1960, 50 yrs. old  GENDER:  male ENDOSCOPIST:  Vania Rea. Jarold Motto, MD, [facility] REF. BY: PROCEDURE DATE:  06/05/2011 PROCEDURE:  Colonoscopy with snare polypectomy ASA CLASS:  Class II INDICATIONS:  Colorectal cancer screening, average risk, FOBT positive stool MEDICATIONS:   propofol (Diprivan) 350 mg IV  DESCRIPTION OF PROCEDURE:   After the risks and benefits and of the procedure were explained, informed consent was obtained. Digital rectal exam was performed and revealed no abnormalities. The LB 180AL [id number] endoscope was introduced through the anus and advanced to the cecum, which was identified by both the appendix and ileocecal valve.  The quality of the prep was excellent, using MoviPrep.  The instrument was then slowly withdrawn as the colon was fully examined. <<PROCEDUREIMAGES>>  FINDINGS:  There were mild diverticular changes in left colon. diverticulosis was found.  A sessile polyp was found in the sigmoid colon. 5 MM VASCULAR SIGMOID POLYP HOT SNARE EXCISED AND TISSUE TO LAB.  This was otherwise a normal examination of the colon.   Retroflexed views in the rectum revealed no abnormalities.    The scope was then withdrawn from the patient and the procedure completed.  COMPLICATIONS:  None ENDOSCOPIC IMPRESSION: 1) Diverticulosis,mild,left sided diverticulosis 2) Sessile polyp in the sigmoid colon 3) Otherwise normal examination R/O ADENOMA. RECOMMENDATIONS: 1) Repeat colonoscopy in 5 years if polyp adenomatous; otherwise 10 years 2) High fiber diet.  REPEAT EXAM:  No  ______________________________ Vania Rea. Jarold Motto, MD, [facility]  CC:  n. eSIGNED:   Vania Rea. Makenleigh Crownover at 06/05/2011 02:39 PM  Allayne Stack, [phone]

## 2011-06-06 ENCOUNTER — Telehealth: Payer: Self-pay

## 2011-06-06 NOTE — Telephone Encounter (Signed)
  Follow up Call-  Call back number 06/05/2011  Post procedure Call Back phone  # [phone]  Permission to leave phone message Yes     Patient questions:  Do you have a fever, pain , or abdominal swelling? no Pain Score  0 *  Have you tolerated food without any problems? yes  Have you been able to return to your normal activities? yes  Do you have any questions about your discharge instructions: Diet   no Medications  no Follow up visit  no  Do you have questions or concerns about your Care? no  Actions: * If pain score is 4 or above: No action needed, pain <4.  Per the pt" everything went well". Maw

## 2011-06-09 ENCOUNTER — Encounter: Payer: Self-pay | Admitting: Gastroenterology

## 2011-08-16 ENCOUNTER — Encounter: Payer: Self-pay | Admitting: Medical

## 2011-08-16 ENCOUNTER — Ambulatory Visit (INDEPENDENT_AMBULATORY_CARE_PROVIDER_SITE_OTHER): Payer: BC Managed Care – PPO | Admitting: Medical

## 2011-08-16 VITALS — BP 130/82 | HR 80 | Temp 99.4°F | Resp 16 | Wt 172.0 lb

## 2011-08-16 DIAGNOSIS — R630 Anorexia: Secondary | ICD-10-CM

## 2011-08-16 DIAGNOSIS — R5383 Other fatigue: Secondary | ICD-10-CM

## 2011-08-16 DIAGNOSIS — N411 Chronic prostatitis: Secondary | ICD-10-CM

## 2011-08-16 DIAGNOSIS — M545 Low back pain, unspecified: Secondary | ICD-10-CM

## 2011-08-16 DIAGNOSIS — R110 Nausea: Secondary | ICD-10-CM

## 2011-08-16 DIAGNOSIS — R5381 Other malaise: Secondary | ICD-10-CM

## 2011-08-16 LAB — POCT URINALYSIS DIPSTICK
Glucose, UA: NEGATIVE
Ketones, UA: NEGATIVE
Nitrite, UA: NEGATIVE
pH, UA: 5

## 2011-08-16 MED ORDER — NAPROXEN SODIUM ER 500 MG PO TB24: 500.0000 mg | ORAL_TABLET | Freq: Every day | ORAL | Status: DC

## 2011-08-16 MED ORDER — CIPROFLOXACIN HCL 500 MG PO TABS: 500.0000 mg | ORAL_TABLET | Freq: Two times a day (BID) | ORAL | Status: AC

## 2011-08-16 NOTE — Progress Notes (Signed)
Subjective:   HPI  Earl Sanchez is a 51 y.o. male who presents today with 1.5 week hx/o low back pain.  He notes low energy, no appetite, bubbly stomach and pain in his eye sockets.   Back pain is low and midline, no radiation.  He denies bowel or bladder issues.  No abdominal pain but does have discomfort in back of scrotum.  No urine stream changes, no ejaculate changes.  No numbness, no tingling, no weakness.  Barely can get out of the bed in the morning, can get up due to 10/10 pain.   Having difficulty with forward flexion of the back, similar to back pain prior with onset of ankylosing spondylitis in remote past.  Very stiff and limited ROM.  No other aggravating or relieving factors.  He works as a Pensions consultant.  He was seen here in April, had STD screening and prostate was a little enlarged.  No other c/o.  The following portions of the patient's history were reviewed and updated as appropriate: allergies, current medications, past family history, past medical history, past social history, past surgical history and problem list.  Past Medical History  Diagnosis Date  . Hypertension   . Ankylosing spondylitis   . Microscopic hematuria   . Erectile dysfunction     No Known Allergies   Review of Systems Gen: fatigue, no appetite, no fever, chills, sweats GI: nausea, abdomen feels bubbly and strange. No flatulence or belching.  No constipation, diarrhea, blood in stool GU: negative MSK: negative Neuro: negative ROS reviewed and was negative other than noted in HPI or above.    Objective:   Physical Exam  General appearance: alert, no distress, WD/WN, black male Oral cavity: MMM, no lesions Neck: supple, no lymphadenopathy, no thyromegaly, no masses Heart: RRR, normal S1, S2, no murmurs Lungs: CTA bilaterally, no wheezes, rhonchi, or rales Abdomen: +bs, soft, non tender, non distended, no masses, no hepatomegaly, no splenomegaly Back: nontender but limited ROM due to pain, no  scoliosis GU: nontender, normal male genitalia, no mass, no hernia or lymphadenopathy Pulses: 2+ symmetric Rectal: deferred   Assessment and Plan :     Encounter Diagnoses  Name Primary?  . Pain in lower back Yes  . Chronic prostatitis   . Nausea   . Malaise   . Anorexia    Pain in low back - possible related to prostatitis but more likely a flare up of the ankylosing spondylitis.  Samples of Naprelan given, 1000mg  daily x 2 days, then 750mg  daily for 3 days, then can use OTC NSAID prn  His other symptoms and abnormal urinalysis suggests prostatitis.  reviewed prior records, he has had hematuria, and he was treated for the possible prostatitis vs other infection a few months ago, had negative STD screening at that time.  We will begin Cipro for 30 days and recheck.  Urine culture sent.

## 2011-08-17 ENCOUNTER — Encounter: Payer: Self-pay | Admitting: Medical

## 2011-08-18 ENCOUNTER — Ambulatory Visit (INDEPENDENT_AMBULATORY_CARE_PROVIDER_SITE_OTHER): Payer: BC Managed Care – PPO | Admitting: Family Medicine

## 2011-08-18 ENCOUNTER — Other Ambulatory Visit: Payer: Self-pay | Admitting: Family Medicine

## 2011-08-18 VITALS — BP 130/80 | HR 77 | Temp 98.8°F | Wt 164.0 lb

## 2011-08-18 DIAGNOSIS — R509 Fever, unspecified: Secondary | ICD-10-CM

## 2011-08-18 DIAGNOSIS — R141 Gas pain: Secondary | ICD-10-CM

## 2011-08-18 DIAGNOSIS — R140 Abdominal distension (gaseous): Secondary | ICD-10-CM

## 2011-08-18 LAB — URINE CULTURE: Organism ID, Bacteria: NO GROWTH

## 2011-08-18 NOTE — Patient Instructions (Signed)
Use the Dexilant every day. If you have difficulty over the weekend call me and I will help you taking care of in the ER.

## 2011-08-18 NOTE — Progress Notes (Signed)
  Subjective:    Patient ID: Earl Sanchez, male    DOB: Oct 12, 1960, 51 y.o.   MRN: [id number]  HPI He has a ten-day history of fever, chills, abdominal bloating with occasional vomiting but no diarrhea. He was in [place] or approximately one month ago had no difficulty while he was there. He is also had a flare of his ankylosing spondylitis with back pain that did respond to Naprelan. He also has a documented weight loss from 172 down to 164  Review of Systems     Objective:   Physical Exam alert and in no distress. Tympanic membranes and canals are normal. Throat is clear. Tonsils are normal. Neck is supple without adenopathy or thyromegaly. Cardiac exam shows a regular sinus rhythm without murmurs or gallops. Lungs are clear to auscultation.        Assessment & Plan:   1. Fever and chills  CBC with Differential, Comprehensive metabolic panel  2. Abdominal bloating  CBC with Differential, Comprehensive metabolic panel   the etiology of this is unclear but his trip to [place] certainly makes me wonder about an infectious etiology. A sample of Dexilant given to see if this will help with his abdominal distress. Encouraged him to call me over the weekend if he has further difficulty

## 2011-08-19 LAB — COMPREHENSIVE METABOLIC PANEL
ALT: 5025 U/L — ABNORMAL HIGH (ref 0–53)
AST: 4953 U/L — ABNORMAL HIGH (ref 0–37)
Albumin: 4 g/dL (ref 3.5–5.2)
CO2: 28 mEq/L (ref 19–32)
Calcium: 8.9 mg/dL (ref 8.4–10.5)
Chloride: 101 mEq/L (ref 96–112)
Potassium: 4.2 mEq/L (ref 3.5–5.3)
Sodium: 135 mEq/L (ref 135–145)
Total Protein: 7.1 g/dL (ref 6.0–8.3)

## 2011-08-19 LAB — CBC WITH DIFFERENTIAL/PLATELET
Basophils Absolute: 0.1 10*3/uL (ref 0.0–0.1)
Lymphocytes Relative: 40 % (ref 12–46)
Lymphs Abs: 1.3 10*3/uL (ref 0.7–4.0)
MCV: 81.6 fL (ref 78.0–100.0)
Neutro Abs: 1.2 10*3/uL — ABNORMAL LOW (ref 1.7–7.7)
Platelets: 271 10*3/uL (ref 150–400)
RBC: 5.12 MIL/uL (ref 4.22–5.81)
RDW: 15.4 % (ref 11.5–15.5)
WBC: 3.3 10*3/uL — ABNORMAL LOW (ref 4.0–10.5)

## 2011-08-21 LAB — HEPATITIS B SURFACE ANTIBODY,QUALITATIVE: Hep B S Ab: NEGATIVE

## 2011-08-23 ENCOUNTER — Ambulatory Visit (INDEPENDENT_AMBULATORY_CARE_PROVIDER_SITE_OTHER): Payer: BC Managed Care – PPO | Admitting: Family Medicine

## 2011-08-23 DIAGNOSIS — B159 Hepatitis A without hepatic coma: Secondary | ICD-10-CM

## 2011-08-23 NOTE — Patient Instructions (Signed)
Hepatitis A Hepatitis A is a viral infection of the liver. There is no evidence that this form of hepatitis progresses to a longstanding (chronic) form. Hepatitis A does not progress to cancer of the liver as it may with chronic forms of hepatitis B and hepatitis C. CAUSES Hepatitis A is caused by the hepatitis A virus (HAV). The virus enters the body through the mouth by unclean (contaminated) food or water. Once in the body, viral infection begins in the bowel and spreads to the liver, causing illness.  SYMPTOMS Most cases of hepatitis A are mild and cause few problems. Many people may not even realize they are sick. Others may feel tired, have nausea and vomiting, lose their appetite, have a low-grade fever, or experience pain under the ribs on the right side of the belly.Later on, symptoms may include dark-colored urine, light-colored bowel movements, yellowing of the eyes and skin (jaundice), or itchy skin. Hepatitis A can sometimes become severe and some symptoms may last for weeks.  DIAGNOSIS Your caregiver can do a blood test to see if you have the disease. TREATMENT Hepatitis A usually resolves on its own over several weeks. There is no specific treatment for the disease once the virus has caused infection. It is important for the infected person to get plenty of rest and not return to work or school until fever is gone, appetite returns, and skin and eyes are no longer yellow.It is also important to avoid drinking alcohol and taking any medicine not prescribed or approved by your caregiver. PREVENTION Giving human immunoglobulin following exposure will help prevent or lessen the severity of the illness. This is only effective if given within 2 weeks following exposure.  Hepatitis A vaccine is available and highly effective in preventing hepatitis A when given both before and after exposure to this infection.In the [place] this vaccine is currently recommended as a substitute for human  immunoglobulin within 2 weeks following exposure to the virus as well as for the following people before exposure:  All children beginning at age 26 year.   Children ages 2 to 28 in states and communities with existing vaccination programs.   International travelers to all areas where hepatitis A remains a problem.   Men who have sex with men.   Injection drug users.   People with chronic liver disease due to any cause.   People receiving clotting factor concentrates.   People who work with the hepatitis A virus in research lab settings.  HOME CARE INSTRUCTIONS After becoming infected with the HAV, there will be a 2 to 6 week incubation period before getting sick. There will be shedding of the virus in the stool within 10 to 14 days of the infection. Because of the passing of viral particles in the stool, good hygiene will help prevent the spread of this disease.  Frequent hand washing following use of the bathroom and before handling food or water is encouraged.   This infection is contagious. Follow your caregiver's instructions in order to avoid spread of the infection.   People who live with you should receive the hepatitis A vaccine.   Do not take any medicines, including common nonprescription medicines, until you ask your caregiver if it is okay.  SEEK IMMEDIATE MEDICAL CARE IF:   You are unable to eat or drink.   Nausea or vomiting starts or continues.   You feel confused.   Jaundice becomes more severe.   You become very sleepy or have trouble waking up.  MAKE SURE YOU:   Understand these instructions.   Will watch your condition.   Will get help right away if you are not doing well or get worse.  Document Released: 02/18/2000 Document Revised: 02/09/2011 Document Reviewed: 06/21/2010 [facility] Patient Information 2012 [place], [place].

## 2011-08-23 NOTE — Progress Notes (Signed)
  Subjective:    Patient ID: Earl Sanchez, male    DOB: [birth year], 51 y.o.   MRN: [id number]  HPI He is here for recheck. Since his last visit he has noticed yellow  eyes, a change in color of his stool as well as one episode of Coca-Cola colored urine. He states he is now getting his appetite as well as strength back. Recent blood work did show elevated liver enzymes with positive hepatitis A.   Review of Systems     Objective:   Physical Exam Alert and in no distress. Sclera are yellow.       Assessment & Plan:   1. Hepatitis A infection    the diagnosis and prognosis for hepatitis A was discussed with him. Information was given to him concerning this. He will come back in one week for recheck.

## 2011-08-31 ENCOUNTER — Ambulatory Visit (INDEPENDENT_AMBULATORY_CARE_PROVIDER_SITE_OTHER): Payer: BC Managed Care – PPO | Admitting: Family Medicine

## 2011-08-31 VITALS — BP 116/78 | HR 80 | Wt 168.0 lb

## 2011-08-31 DIAGNOSIS — M459 Ankylosing spondylitis of unspecified sites in spine: Secondary | ICD-10-CM

## 2011-08-31 DIAGNOSIS — B159 Hepatitis A without hepatic coma: Secondary | ICD-10-CM

## 2011-08-31 LAB — HEPATIC FUNCTION PANEL
ALT: 333 U/L — ABNORMAL HIGH (ref 0–53)
AST: 97 U/L — ABNORMAL HIGH (ref 0–37)
Albumin: 3.9 g/dL (ref 3.5–5.2)
Bilirubin, Direct: 1.1 mg/dL — ABNORMAL HIGH (ref 0.0–0.3)
Total Protein: 6.7 g/dL (ref 6.0–8.3)

## 2011-08-31 MED ORDER — NAPROXEN SODIUM ER 500 MG PO TB24: 500.0000 mg | ORAL_TABLET | Freq: Every day | ORAL | Status: DC

## 2011-08-31 NOTE — Progress Notes (Signed)
  Subjective:    Patient ID: Earl Sanchez, male    DOB: 05-Sep-1960, 51 y.o.   MRN: [id number]  HPI He is here for recheck. He states he has more energy and has gotten his appetite back. He was given Naprelan to help with his ankylosing spondylitis and finds it quite useful. He would like a refill on this.   Review of Systems     Objective:   Physical Exam Alert and in no distress. Sclera appear slightly yellow. Abdominal exam shows no hepatosplenomegaly.      Assessment & Plan:   1. Hepatitis A infection  Hepatic function panel  2. Ankylosing spondylitis  naproxen (NAPRELAN) 500 MG 24 hr tablet   card given to keep the cost down for the Naprelan

## 2011-08-31 NOTE — Patient Instructions (Signed)
I would still recommend avoiding alcohol.

## 2011-11-22 ENCOUNTER — Other Ambulatory Visit: Payer: Self-pay | Admitting: Family Medicine

## 2012-01-04 ENCOUNTER — Encounter: Payer: Self-pay | Admitting: Family Medicine

## 2012-01-04 ENCOUNTER — Ambulatory Visit (INDEPENDENT_AMBULATORY_CARE_PROVIDER_SITE_OTHER): Payer: BC Managed Care – PPO | Admitting: Family Medicine

## 2012-01-04 VITALS — BP 120/70 | HR 78 | Wt 173.0 lb

## 2012-01-04 DIAGNOSIS — Z7189 Other specified counseling: Secondary | ICD-10-CM

## 2012-01-04 DIAGNOSIS — Z7184 Encounter for health counseling related to travel: Secondary | ICD-10-CM

## 2012-01-04 DIAGNOSIS — Z23 Encounter for immunization: Secondary | ICD-10-CM

## 2012-01-04 DIAGNOSIS — N529 Male erectile dysfunction, unspecified: Secondary | ICD-10-CM

## 2012-01-04 MED ORDER — TADALAFIL 20 MG PO TABS: 20.0000 mg | ORAL_TABLET | Freq: Every day | ORAL | Status: DC | PRN

## 2012-01-04 NOTE — Progress Notes (Signed)
  Subjective:    Patient ID: Earl Sanchez, male    DOB: 1960-03-11, 51 y.o.   MRN: [id number]  HPI He is here for consultation concerning an impending trip to [place]. He does make this trip intermittently and will be going back again sometime in the future. He did get hepatitis A on a previous overseas trip. He would like to make sure he is up to date on his shots. He also would like a refill on his Cialis.   Review of Systems     Objective:   Physical Exam Alert and in no distress otherwise not examined      Assessment & Plan:   1. ED (erectile dysfunction)  tadalafil (CIALIS) 20 MG tablet  2. Counseling for travel    3. Need for prophylactic vaccination and inoculation against influenza  Flu vaccine greater than or equal to 3yo preservative free IM   I1 over immunizations with him. I feel it would be worthwhile for him to get a typhoid as well as yellow fever shot. Gave him information to get this through the [facility] travel medicine Department. Also recommend he take doxycycline to help prevent malaria. Discussed the fact that he needs to do this for approximately one month after leaving. He does have his own supply. I will also give him hepatitis B injections although his risk of blood or sexual transmission is quite low. He was also given a flu shot. Risks and benefits of both injections were discussed.

## 2012-01-29 ENCOUNTER — Encounter: Payer: BC Managed Care – PPO | Admitting: Family Medicine

## 2012-02-07 ENCOUNTER — Ambulatory Visit: Payer: 59

## 2012-02-07 ENCOUNTER — Ambulatory Visit (INDEPENDENT_AMBULATORY_CARE_PROVIDER_SITE_OTHER): Payer: 59 | Admitting: Family Medicine

## 2012-02-07 VITALS — BP 128/88 | HR 100 | Temp 98.4°F | Resp 16 | Ht 69.0 in | Wt 175.0 lb

## 2012-02-07 DIAGNOSIS — R0989 Other specified symptoms and signs involving the circulatory and respiratory systems: Secondary | ICD-10-CM

## 2012-02-07 DIAGNOSIS — R0600 Dyspnea, unspecified: Secondary | ICD-10-CM

## 2012-02-07 DIAGNOSIS — R0609 Other forms of dyspnea: Secondary | ICD-10-CM

## 2012-02-07 DIAGNOSIS — R000 Tachycardia, unspecified: Secondary | ICD-10-CM

## 2012-02-07 DIAGNOSIS — I4891 Unspecified atrial fibrillation: Secondary | ICD-10-CM

## 2012-02-07 DIAGNOSIS — I10 Essential (primary) hypertension: Secondary | ICD-10-CM

## 2012-02-07 LAB — POCT CBC
Granulocyte percent: 53.4 %G (ref 37–80)
HCT, POC: 54.3 % — AB (ref 43.5–53.7)
Lymph, poc: 2.4 (ref 0.6–3.4)
MCHC: 31.1 g/dL — AB (ref 31.8–35.4)
MID (cbc): 0.6 (ref 0–0.9)
POC Granulocyte: 3.4 (ref 2–6.9)
POC LYMPH PERCENT: 37.9 %L (ref 10–50)
Platelet Count, POC: 317 10*3/uL (ref 142–424)
RDW, POC: 14.5 %

## 2012-02-07 LAB — COMPREHENSIVE METABOLIC PANEL
Alkaline Phosphatase: 65 U/L (ref 39–117)
CO2: 30 mEq/L (ref 19–32)
Creat: 1.02 mg/dL (ref 0.50–1.35)
Glucose, Bld: 107 mg/dL — ABNORMAL HIGH (ref 70–99)
Sodium: 140 mEq/L (ref 135–145)
Total Bilirubin: 1.5 mg/dL — ABNORMAL HIGH (ref 0.3–1.2)

## 2012-02-07 LAB — T4, FREE: Free T4: 1.25 ng/dL (ref 0.80–1.80)

## 2012-02-07 LAB — TSH: TSH: 0.788 u[IU]/mL (ref 0.350–4.500)

## 2012-02-07 MED ORDER — METOPROLOL TARTRATE 25 MG PO TABS: 25.0000 mg | ORAL_TABLET | Freq: Two times a day (BID) | ORAL | Status: DC

## 2012-02-07 NOTE — Progress Notes (Signed)
[address]   [place], [place]  [address]   [phone]  Subjective:    Patient ID: Earl Sanchez, male    DOB: 1960/11/09, 51 y.o.   MRN: [id number]  HPIThis 51 y.o. male presents for evaluation of irregular heartbeat.  Constant sensation of irregular heartbeat.  Rapid heartbeat.  No chest pain, SOB, swelling.  No dizziness, +fatigue.  No SOB.  Rare caffeine except on day of onset; did have 1-2 cups of coffee that morning.  +DOE after exercise at work.  No lack of sleep.  No major stressors. Brother who is two years older than patient recently had irregular heart rhythm with cardioversion.     Review of Systems  Constitutional: Negative for fever, chills, diaphoresis and fatigue.  HENT: Negative for congestion and rhinorrhea.   Respiratory: Negative for cough, choking, shortness of breath, wheezing and stridor.        +DOE.  Cardiovascular: Positive for palpitations. Negative for chest pain and leg swelling.  Gastrointestinal: Negative for nausea and vomiting.  Psychiatric/Behavioral: Negative for sleep disturbance. The patient is not nervous/anxious.         Past Medical History  Diagnosis Date  . Hypertension   . Ankylosing spondylitis   . Microscopic hematuria   . Erectile dysfunction     Past Surgical History  Procedure Date  . Lymph gland excision 1989    left neck/benign    Prior to Admission medications   Medication Sig Start Date End Date Taking? Authorizing Provider  amLODipine (NORVASC) 10 MG tablet take 1 tablet by mouth once daily 11/22/11  Yes Ronnald Nian, MD  tadalafil (CIALIS) 20 MG tablet Take 1 tablet (20 mg total) by mouth daily as needed. For E.D. 01/04/12  Yes Ronnald Nian, MD    No Known Allergies  History   Social History  . Marital Status: Single    Spouse Name: N/A    Number of Children: N/A  . Years of Education: N/A   Occupational History  . Not on file.   Social History Main Topics  . Smoking status: Never Smoker   .  Smokeless tobacco: Never Used  . Alcohol Use: 8.4 oz/week    14 Cans of beer per week  . Drug Use: No  . Sexually Active: Yes    Birth Control/ Protection: Condom   Other Topics Concern  . Not on file   Social History Narrative  . No narrative on file    Family History  Problem Relation Age of Onset  . Arthritis Mother   . Hypertension Mother   . Colon polyps Brother   . Hypertension Father     Objective:   Physical Exam  Nursing note and vitals reviewed. Constitutional: He is oriented to person, place, and time. He appears well-developed and well-nourished. No distress.  Eyes: Conjunctivae normal are normal. Pupils are equal, round, and reactive to light.  Neck: Normal range of motion. Neck supple. No thyromegaly present.  Cardiovascular: Intact distal pulses.  An irregularly irregular rhythm present. Exam reveals no gallop and no friction rub.   No murmur heard. Pulmonary/Chest: Effort normal and breath sounds normal. No respiratory distress. He has no wheezes. He has no rales.  Abdominal: Soft. Bowel sounds are normal. He exhibits no distension. There is no tenderness. There is no rebound and no guarding.  Lymphadenopathy:    He has no cervical adenopathy.  Neurological: He is alert and oriented to person, place, and time. No cranial nerve  deficit. He exhibits normal muscle tone. Coordination normal.  Skin: Skin is warm and dry. He is not diaphoretic. No pallor.  Psychiatric: He has a normal mood and affect. His behavior is normal. Judgment and thought content normal.       Results for orders placed in visit on 02/07/12  POCT CBC      Component Value Range   WBC 6.4  4.6 - 10.2 K/uL   Lymph, poc 2.4  0.6 - 3.4   POC LYMPH PERCENT 37.9  10 - 50 %L   MID (cbc) 0.6  0 - 0.9   POC MID % 8.7  0 - 12 %M   POC Granulocyte 3.4  2 - 6.9   Granulocyte percent 53.4  37 - 80 %G   RBC 5.85  4.69 - 6.13 M/uL   Hemoglobin 16.9  14.1 - 18.1 g/dL   HCT, POC 19.1 (*) 47.8 - 53.7  %   MCV 92.8  80 - 97 fL   MCH, POC 28.9  27 - 31.2 pg   MCHC 31.1 (*) 31.8 - 35.4 g/dL   RDW, POC 29.5     Platelet Count, POC 317  142 - 424 K/uL   MPV 7.8  0 - 99.8 fL   [facility] reading (PRIMARY) by  Dr. Katrinka Blazing.  CXR: NAD  EKG:  ATRIAL FIBRILLATION VERSUS FLUTTER; TACHYCARDIA WITH RATE OF 122.  NO ACUTE CHANGES.   Assessment & Plan:   1. Rapid heart beat  EKG 12-Lead, POCT CBC, DG Chest 1 View, TSH, Comprehensive metabolic panel, T4, free, DG Chest 2 View  2. Atrial fibrillation  Ambulatory referral to Cardiology  3. Dyspnea    4. Essential hypertension, benign       1.  Atrial Fibrillation with RVR:  New onset likely in past three days.  Rx for Metoprolol Tartate 25mg  one po bid provided; start ASA 325mg  one daily.  Refer to cardiology for 2D-echo and further management. Obtain labs including TSH.   2.  Dyspnea on exertion:  New. Mild. Likely due to tachycardia associated with Atrial Fibrillation with RVR.  CXR negative. 3.  HTN: controlled; start Metoprolol 25mg  one po bid; decrease Amlodipine 10mg  to 1/2 tablet daily.  Meds ordered this encounter  Medications  . metoprolol (LOPRESSOR) 25 MG tablet    Sig: Take 1 tablet (25 mg total) by mouth 2 (two) times daily.    Dispense:  60 tablet    Refill:  1

## 2012-02-07 NOTE — Patient Instructions (Addendum)
1. Rapid heart beat  EKG 12-Lead, POCT CBC, DG Chest 1 View, TSH, Comprehensive metabolic panel, T4, free, DG Chest 2 View  2. Atrial fibrillation  Ambulatory referral to Cardiology      START ASPIRIN 325MG  ONE DAILY.  Atrial Fibrillation Your caregiver has diagnosed you with atrial fibrillation (AFib). The heart normally beats very regularly; AFib is a type of irregular heartbeat. The heart rate may be faster or slower than normal. This can prevent your heart from pumping as well as it should. AFib can be constant (chronic) or intermittent (paroxysmal). CAUSES  Atrial fibrillation may be caused by:  Heart disease, including heart attack, coronary artery disease, heart failure, diseases of the heart valves, and others.  Blood clot in the lungs (pulmonary embolism).  Pneumonia or other infections.  Chronic lung disease.  Thyroid disease.  Toxins. These include alcohol, some medications (such as decongestant medications or diet pills), and caffeine. In some people, no cause for AFib can be found. This is referred to as Lone Atrial Fibrillation. SYMPTOMS   Palpitations or a fluttering in your chest.  A vague sense of chest discomfort.  Shortness of breath.  Sudden onset of lightheadedness or weakness. Sometimes, the first sign of AFib can be a complication of the condition. This could be a stroke or heart failure. DIAGNOSIS  Your description of your condition may make your caregiver suspicious of atrial fibrillation. Your caregiver will examine your pulse to determine if fibrillation is present. An EKG (electrocardiogram) will confirm the diagnosis. Further testing may help determine what caused you to have atrial fibrillation. This may include chest x-ray, echocardiogram, blood tests, or CT scans. PREVENTION  If you have previously had atrial fibrillation, your caregiver may advise you to avoid substances known to cause the condition (such as stimulant medications, and possibly  caffeine or alcohol). You may be advised to use medications to prevent recurrence. Proper treatment of any underlying condition is important to help prevent recurrence. PROGNOSIS  Atrial fibrillation does tend to become a chronic condition over time. It can cause significant complications (see below). Atrial fibrillation is not usually immediately life-threatening, but it can shorten your life expectancy. This seems to be worse in women. If you have lone atrial fibrillation and are under 50 years old, the risk of complications is very low, and life expectancy is not shortened. RISKS AND COMPLICATIONS  Complications of atrial fibrillation can include stroke, chest pain, and heart failure. Your caregiver will recommend treatments for the atrial fibrillation, as well as for any underlying conditions, to help minimize risk of complications. TREATMENT  Treatment for AFib is divided into several categories:  Treatment of any underlying condition.  Converting you out of AFib into a regular (sinus) rhythm.  Controlling rapid heart rate.  Prevention of blood clots and stroke. Medications and procedures are available to convert your atrial fibrillation to sinus rhythm. However, recent studies have shown that this may not offer you any advantage, and cardiac experts are continuing research and debate on this topic. More important is controlling your rapid heartbeat. The rapid heartbeat causes more symptoms, and places strain on your heart. Your caregiver will advise you on the use of medications that can control your heart rate. Atrial fibrillation is a strong stroke risk. You can lessen this risk by taking blood thinning medications such as Coumadin (warfarin), or sometimes aspirin. These medications need close monitoring by your caregiver. Over-medication can cause bleeding. Too little medication may not protect against stroke. HOME CARE INSTRUCTIONS  If your caregiver prescribed medicine to make your  heartbeat more normally, take as directed.  If blood thinners were prescribed by your caregiver, take EXACTLY as directed.  Perform blood tests EXACTLY as directed.  Quit smoking. Smoking increases your cardiac and lung (pulmonary) risks.  DO NOT drink alcohol.  DO NOT drink caffeinated drinks (e.g. coffee, soda, chocolate, and leaf teas). You may drink decaffeinated coffee, soda or tea.  If you are overweight, you should choose a reduced calorie diet to lose weight. Please see a registered dietitian if you need more information about healthy weight loss. DO NOT USE DIET PILLS as they may aggravate heart problems.  If you have other heart problems that are causing AFib, you may need to eat a low salt, fat, and cholesterol diet. Your caregiver will tell you if this is necessary.  Exercise every day to improve your physical fitness. Stay active unless advised otherwise.  If your caregiver has given you a follow-up appointment, it is very important to keep that appointment. Not keeping the appointment could result in heart failure or stroke. If there is any problem keeping the appointment, you must call back to this facility for assistance. SEEK MEDICAL CARE IF:  You notice a change in the rate, rhythm or strength of your heartbeat.  You develop an infection or any other change in your overall health status. SEEK IMMEDIATE MEDICAL CARE IF:   You develop chest pain, abdominal pain, sweating, weakness or feel sick to your stomach (nausea).  You develop shortness of breath.  You develop swollen feet and ankles.  You develop dizziness, numbness, or weakness of your face or limbs, or any change in vision or speech. MAKE SURE YOU:   Understand these instructions.  Will watch your condition.  Will get help right away if you are not doing well or get worse. Document Released: 02/20/2005 Document Revised: 05/15/2011 Document Reviewed: 09/25/2007 [facility] Patient Information 2013  [place], [place].

## 2012-02-22 ENCOUNTER — Encounter: Payer: Self-pay | Admitting: *Deleted

## 2012-02-22 ENCOUNTER — Encounter: Payer: Self-pay | Admitting: Cardiovascular Disease

## 2012-02-23 ENCOUNTER — Ambulatory Visit (INDEPENDENT_AMBULATORY_CARE_PROVIDER_SITE_OTHER): Payer: 59 | Admitting: Cardiovascular Disease

## 2012-02-23 ENCOUNTER — Encounter: Payer: Self-pay | Admitting: Cardiovascular Disease

## 2012-02-23 VITALS — BP 143/92 | HR 87 | Resp 14 | Ht 68.0 in | Wt 172.0 lb

## 2012-02-23 DIAGNOSIS — F1020 Alcohol dependence, uncomplicated: Secondary | ICD-10-CM

## 2012-02-23 DIAGNOSIS — Z7289 Other problems related to lifestyle: Secondary | ICD-10-CM | POA: Insufficient documentation

## 2012-02-23 DIAGNOSIS — Z8679 Personal history of other diseases of the circulatory system: Secondary | ICD-10-CM | POA: Insufficient documentation

## 2012-02-23 DIAGNOSIS — I4891 Unspecified atrial fibrillation: Secondary | ICD-10-CM

## 2012-02-23 NOTE — Assessment & Plan Note (Signed)
Well controlled.  Continue current medications and low sodium Dash type diet.    

## 2012-02-23 NOTE — Patient Instructions (Signed)
Your physician recommends that you schedule a follow-up appointment in:  4 WEEKS WITH DR [facility]  Your physician has recommended you make the following change in your medication: START XARELTO 20 MG  1  WITH  EVENING MEAL GO BACK TO 10 MG ON AMLODIPINE.

## 2012-02-23 NOTE — Progress Notes (Signed)
Patient ID: Earl Sanchez, male   DOB: 1960/09/01, 51 y.o.   MRN: [id number] HPIThis 50 y.o. male presents for evaluation of irregular heartbeat. Constant sensation of irregular heartbeat. Rapid heartbeat. No chest pain, SOB, swelling. No dizziness, +fatigue. No SOB. Rare caffeine except on day of onset; did have 1-2 cups of coffee that morning. +DOE after exercise at work. No lack of sleep. No major stressors. Brother who is two years older than patient recently had irregular heart rhythm with cardioversion.  Both he and his brother drink too much alcohol.  He denies alcoholism but drinks 2-3 beers/day and more on weekends.  Relationship betwenn ETOH and afib discussed.  Also discussed need for rate control, anticoagulation and conversion strategies.  Started on metoprololol last week at urgent care. No bleeding problems Reviewed lab work and Hct and Cr normal  ROS: Denies fever, malais, weight loss, blurry vision, decreased visual acuity, cough, sputum, SOB, hemoptysis, pleuritic pain, palpitaitons, heartburn, abdominal pain, melena, lower extremity edema, claudication, or rash.  All other systems reviewed and negative   General: Affect appropriate Healthy:  appears stated age HEENT: normal Neck supple with no adenopathy JVP normal no bruits no thyromegaly Lungs clear with no wheezing and good diaphragmatic motion Heart:  S1/S2 no murmur,rub, gallop or click PMI normal Abdomen: benighn, BS positve, no tenderness, no AAA no bruit.  No HSM or HJR Distal pulses intact with no bruits No edema Neuro non-focal Skin warm and dry No muscular weakness  Medications Current Outpatient Prescriptions  Medication Sig Dispense Refill  . amLODipine (NORVASC) 10 MG tablet take 1 tablet by mouth once daily  30 tablet  5  . metoprolol (LOPRESSOR) 25 MG tablet Take 1 tablet (25 mg total) by mouth 2 (two) times daily.  60 tablet  1  . tadalafil (CIALIS) 20 MG tablet Take 1 tablet (20 mg total) by mouth daily  as needed. For E.D.  10 tablet  11   Current Facility-Administered Medications  Medication Dose Route Frequency Provider Last Rate Last Dose  . 0.9 %  sodium chloride infusion  500 mL Intravenous Continuous Mardella Layman, MD        Allergies Review of patient's allergies indicates no known allergies.  Family History: Family History  Problem Relation Age of Onset  . Arthritis Mother   . Hypertension Mother   . Colon polyps Brother   . Hypertension Father     Social History: History   Social History  . Marital Status: Single    Spouse Name: N/A    Number of Children: N/A  . Years of Education: N/A   Occupational History  . Not on file.   Social History Main Topics  . Smoking status: Never Smoker   . Smokeless tobacco: Never Used  . Alcohol Use: 8.4 oz/week    14 Cans of beer per week  . Drug Use: No  . Sexually Active: Yes    Birth Control/ Protection: Condom   Other Topics Concern  . Not on file   Social History Narrative  . No narrative on file    Electrocardiogram:  02/07/12 Afib rate 122   Assessment and Plan

## 2012-02-23 NOTE — Assessment & Plan Note (Signed)
Works and normal lifestyle but explained to him that drinking daily is not normal.  Issues with DCM and relation to afib discussed.

## 2012-02-23 NOTE — Assessment & Plan Note (Signed)
Long discussion about need for ETOH abstinence.  Start xarelto 20mg .  Take full 10 mg of amlodipine as rate control still not ideal.  May need to increase lopressor next visit.  Echo to assess EF and atrial sizes.  DCC in 4 weeks

## 2012-02-26 ENCOUNTER — Telehealth: Payer: Self-pay | Admitting: Cardiovascular Disease

## 2012-02-26 MED ORDER — RIVAROXABAN 20 MG PO TABS: 20.0000 mg | ORAL_TABLET | Freq: Every day | ORAL | Status: DC

## 2012-02-26 NOTE — Telephone Encounter (Signed)
PT NOTIFIED SCRIPT SENT VIA EPIC FOR XARELTO  CALLED PHARMACY AND  REFILL CONFIRMED DID RECEIVE./CY

## 2012-02-26 NOTE — Telephone Encounter (Signed)
New problem:   Was seen on last week. Was given 10 pills of xarelto.  Patient thought a rx would be called in he double check with his pharmacy still no refill . Please advise.    [facility] on [address].

## 2012-03-13 ENCOUNTER — Ambulatory Visit (HOSPITAL_COMMUNITY): Payer: 59 | Attending: Cardiovascular Disease | Admitting: Radiology

## 2012-03-13 DIAGNOSIS — I059 Rheumatic mitral valve disease, unspecified: Secondary | ICD-10-CM | POA: Insufficient documentation

## 2012-03-13 DIAGNOSIS — I4891 Unspecified atrial fibrillation: Secondary | ICD-10-CM | POA: Insufficient documentation

## 2012-03-13 DIAGNOSIS — I369 Nonrheumatic tricuspid valve disorder, unspecified: Secondary | ICD-10-CM | POA: Insufficient documentation

## 2012-03-13 DIAGNOSIS — I10 Essential (primary) hypertension: Secondary | ICD-10-CM | POA: Insufficient documentation

## 2012-03-13 NOTE — Progress Notes (Signed)
Echocardiogram performed.  

## 2012-03-19 ENCOUNTER — Encounter: Payer: Self-pay | Admitting: Cardiovascular Disease

## 2012-03-19 ENCOUNTER — Ambulatory Visit (INDEPENDENT_AMBULATORY_CARE_PROVIDER_SITE_OTHER): Payer: 59 | Admitting: Cardiovascular Disease

## 2012-03-19 ENCOUNTER — Encounter: Payer: Self-pay | Admitting: *Deleted

## 2012-03-19 VITALS — BP 122/84 | HR 94 | Wt 170.0 lb

## 2012-03-19 DIAGNOSIS — I4891 Unspecified atrial fibrillation: Secondary | ICD-10-CM

## 2012-03-19 DIAGNOSIS — R931 Abnormal findings on diagnostic imaging of heart and coronary circulation: Secondary | ICD-10-CM | POA: Insufficient documentation

## 2012-03-19 DIAGNOSIS — I10 Essential (primary) hypertension: Secondary | ICD-10-CM

## 2012-03-19 DIAGNOSIS — R9439 Abnormal result of other cardiovascular function study: Secondary | ICD-10-CM

## 2012-03-19 DIAGNOSIS — Z01810 Encounter for preprocedural cardiovascular examination: Secondary | ICD-10-CM

## 2012-03-19 LAB — CBC WITH DIFFERENTIAL/PLATELET
Basophils Absolute: 0 10*3/uL (ref 0.0–0.1)
Basophils Relative: 0.3 % (ref 0.0–3.0)
Hemoglobin: 14.2 g/dL (ref 13.0–17.0)
Lymphocytes Relative: 38.4 % (ref 12.0–46.0)
Monocytes Relative: 10.6 % (ref 3.0–12.0)
Neutro Abs: 3.2 10*3/uL (ref 1.4–7.7)
Neutrophils Relative %: 48.4 % (ref 43.0–77.0)
RBC: 4.74 Mil/uL (ref 4.22–5.81)

## 2012-03-19 LAB — BASIC METABOLIC PANEL
BUN: 14 mg/dL (ref 6–23)
CO2: 30 mEq/L (ref 19–32)
Calcium: 9.5 mg/dL (ref 8.4–10.5)
Chloride: 105 mEq/L (ref 96–112)
Creatinine, Ser: 1 mg/dL (ref 0.4–1.5)
GFR: 96.2 mL/min (ref >60.00)
Glucose, Bld: 110 mg/dL — ABNORMAL HIGH (ref 70–99)
Potassium: 4.1 mEq/L (ref 3.5–5.1)
Sodium: 140 mEq/L (ref 135–145)

## 2012-03-19 LAB — PROTIME-INR
INR: 1.2 ratio — ABNORMAL HIGH (ref 0.8–1.0)
Prothrombin Time: 12.7 s — ABNORMAL HIGH (ref 10.2–12.4)

## 2012-03-19 LAB — MAGNESIUM: Magnesium: 1.8 mg/dL (ref 1.5–2.5)

## 2012-03-19 NOTE — Assessment & Plan Note (Signed)
Persistant has been on xarelto over 3 weeks Continue AV nodal blocking drugs.  DCC next week.  Risks and details of procedure discussed willing to proceed.  Will need stress test post conversion especially if antiarrythmic drug needed

## 2012-03-19 NOTE — Progress Notes (Signed)
Patient ID: Earl Sanchez, male   DOB: 11/09/1960, 52 y.o.   MRN: [id number] HP  IThis 52 y.o. male presents for evaluation of irregular heartbeat. Constant sensation of irregular heartbeat. Rapid heartbeat. No chest pain, SOB, swelling. No dizziness, +fatigue. No SOB. Rare caffeine except on day of onset; did have 1-2 cups of coffee that morning. +DOE after exercise at work. No lack of sleep. No major stressors. Brother who is two years older than patient recently had irregular heart rhythm with cardioversion. Both he and his brother drink too much alcohol. He denies alcoholism but drinks 2-3 beers/day and more on weekends. Relationship betwenn ETOH and afib discussed. Also discussed need for rate control, anticoagulation and conversion strategies. Started on metoprololol last week at urgent care. No bleeding problems Reviewed lab work and Hct and Cr normal  Echo 1/8 EF 40-45% with moderate LAE  ROS: Denies fever, malais, weight loss, blurry vision, decreased visual acuity, cough, sputum, SOB, hemoptysis, pleuritic pain, palpitaitons, heartburn, abdominal pain, melena, lower extremity edema, claudication, or rash.  All other systems reviewed and negative  General: Affect appropriate Healthy:  appears stated age HEENT: normal Neck supple with no adenopathy JVP normal no bruits no thyromegaly Lungs clear with no wheezing and good diaphragmatic motion Heart:  S1/S2 no murmur, no rub, gallop or click PMI normal Abdomen: benighn, BS positve, no tenderness, no AAA no bruit.  No HSM or HJR Distal pulses intact with no bruits No edema Neuro non-focal Skin warm and dry No muscular weakness   Current Outpatient Prescriptions  Medication Sig Dispense Refill  . amLODipine (NORVASC) 10 MG tablet take 1 tablet by mouth once daily  30 tablet  5  . metoprolol (LOPRESSOR) 25 MG tablet Take 1 tablet (25 mg total) by mouth 2 (two) times daily.  60 tablet  1  . Rivaroxaban (XARELTO) 20 MG TABS Take 1 tablet  (20 mg total) by mouth daily.  30 tablet  6  . tadalafil (CIALIS) 20 MG tablet Take 1 tablet (20 mg total) by mouth daily as needed. For E.D.  10 tablet  11   Current Facility-Administered Medications  Medication Dose Route Frequency Provider Last Rate Last Dose  . 0.9 %  sodium chloride infusion  500 mL Intravenous Continuous David R Patterson, MD        Allergies  Review of patient's allergies indicates no known allergies.  Electrocardiogram:  02/07/12 AFIB OTHERWISE NORMAL  Assessment and Plan  

## 2012-03-19 NOTE — Patient Instructions (Signed)
Your physician has recommended that you have a Cardioversion (DCCV). Electrical Cardioversion uses a jolt of electricity to your heart either through paddles or wired patches attached to your chest. This is a controlled, usually prescheduled, procedure. Defibrillation is done under light anesthesia in the hospital, and you usually go home the day of the procedure. This is done to get your heart back into a normal rhythm. You are not awake for the procedure. Please see the instruction sheet given to you today. Your physician recommends that you continue on your current medications as directed. Please refer to the Current Medication list given to you today. Your physician recommends that you return for lab work in: TODAY BMET CBC  INR AND  MAG  DX V72.81

## 2012-03-19 NOTE — Assessment & Plan Note (Signed)
Well controlled.  Continue current medications and low sodium Dash type diet.    

## 2012-03-19 NOTE — Assessment & Plan Note (Signed)
Will reassess post [facility] May be related to ETOH or rate.  Consider starting ACE and stopping calcium blocker if [facility] successful

## 2012-03-26 ENCOUNTER — Encounter: Payer: Self-pay | Admitting: Family Medicine

## 2012-03-26 ENCOUNTER — Other Ambulatory Visit: Payer: Self-pay | Admitting: Cardiovascular Disease

## 2012-03-26 ENCOUNTER — Ambulatory Visit (INDEPENDENT_AMBULATORY_CARE_PROVIDER_SITE_OTHER): Payer: 59 | Admitting: Family Medicine

## 2012-03-26 VITALS — BP 120/70 | HR 63 | Ht 68.0 in | Wt 170.0 lb

## 2012-03-26 DIAGNOSIS — I4891 Unspecified atrial fibrillation: Secondary | ICD-10-CM

## 2012-03-26 DIAGNOSIS — Z0000 Encounter for general adult medical examination without abnormal findings: Secondary | ICD-10-CM

## 2012-03-26 DIAGNOSIS — Z209 Contact with and (suspected) exposure to unspecified communicable disease: Secondary | ICD-10-CM

## 2012-03-26 DIAGNOSIS — Z23 Encounter for immunization: Secondary | ICD-10-CM

## 2012-03-26 DIAGNOSIS — M459 Ankylosing spondylitis of unspecified sites in spine: Secondary | ICD-10-CM

## 2012-03-26 DIAGNOSIS — F1020 Alcohol dependence, uncomplicated: Secondary | ICD-10-CM

## 2012-03-26 DIAGNOSIS — I10 Essential (primary) hypertension: Secondary | ICD-10-CM

## 2012-03-26 NOTE — Progress Notes (Signed)
Subjective:    Patient ID: Earl Sanchez, male    DOB: May 31, 1960, 52 y.o.   MRN: [id number]  HPI He is here for complete examination. He does have underlying atrial fibrillation and is scheduled for cardioversion tomorrow. He does have a history of alcohol use. I discussed this with him in great detail today. Apparently his brother has similar difficulties and has had to quit drinking. Does have underlying ankles in spite of the latest but is having no difficulty with this. He continues on his blood pressure medication. He would like to be STD testing. His immunizations were reviewed. His work is going well. Social and family history were reviewed. He did have a colonoscopy last year.  Discussed PSA testing and he declined.   Review of Systems  Constitutional: Negative.   Eyes: Negative.   Respiratory: Negative.   Cardiovascular: Negative.   Gastrointestinal: Negative.   Genitourinary: Negative.   Musculoskeletal: Negative.   Skin: Negative.   Neurological: Negative.   Hematological: Negative.   Psychiatric/Behavioral: Negative.        Objective:   Physical Exam BP 120/70  Pulse 63  Ht 5\' 8"  (1.727 m)  Wt 170 lb (77.111 kg)  BMI 25.85 kg/m2  SpO2 99%  General Appearance:    Alert, cooperative, no distress, appears stated age  Head:    Normocephalic, without obvious abnormality, atraumatic  Eyes:    PERRL, conjunctiva/corneas clear, EOM's intact, fundi    benign  Ears:    Normal TM's and external ear canals  Nose:   Nares normal, mucosa normal, no drainage or sinus   tenderness  Throat:   Lips, mucosa, and tongue normal; teeth and gums normal  Neck:   Supple, no lymphadenopathy;  thyroid:  no   enlargement/tenderness/nodules; no carotid   bruit or JVD  Back:    Spine nontender, no curvature, ROM normal, no CVA     tenderness  Lungs:     Clear to auscultation bilaterally without wheezes, rales or     ronchi; respirations unlabored  Chest Wall:    No tenderness or deformity     Heart:    Regular rate and rhythm, S1 and S2 normal, no murmur, rub   or gallop  Breast Exam:    No chest wall tenderness, masses or gynecomastia  Abdomen:     Soft, non-tender, nondistended, normoactive bowel sounds,    no masses, no hepatosplenomegaly  Genitalia:   deferred  Rectal:   deferred   Extremities:   No clubbing, cyanosis or edema  Pulses:   2+ and symmetric all extremities  Skin:   Skin color, texture, turgor normal, no rashes or lesions  Lymph nodes:   Cervical, supraclavicular, and axillary nodes normal  Neurologic:   CNII-XII intact, normal strength, sensation and gait; reflexes 2+ and symmetric throughout          Psych:   Normal mood, affect, hygiene and grooming.           Assessment & Plan:   1. Routine general medical examination at a health care facility  Lipid panel  2. Immunization due  Hepatitis B vaccine adult IM  3. Atrial fibrillation    4. Ankylosing spondylitis    5. Hypertension    6. EtOH dependence    7. Contact with or exposure to unspecified communicable disease  RPR, HIV Antibody, GC/chlamydia probe amp, urine   discussed his cardioversion with him especially in regard to his alcohol consumption and strongly recommended that he  abstain from all alcohol

## 2012-03-27 ENCOUNTER — Telehealth: Payer: Self-pay | Admitting: Cardiovascular Disease

## 2012-03-27 ENCOUNTER — Ambulatory Visit (HOSPITAL_COMMUNITY): Payer: 59 | Admitting: Anesthesiology

## 2012-03-27 ENCOUNTER — Encounter (HOSPITAL_COMMUNITY)
Admission: RE | Disposition: A | Discharge status: Home / Self Care | Payer: Self-pay | Source: Ambulatory Visit | Attending: Cardiovascular Disease

## 2012-03-27 ENCOUNTER — Ambulatory Visit (HOSPITAL_COMMUNITY)
Admission: RE | Admit: 2012-03-27 | Discharge: 2012-03-27 | Disposition: A | Discharge status: Home / Self Care | Payer: 59 | Source: Ambulatory Visit | Attending: Cardiovascular Disease | Admitting: Cardiovascular Disease

## 2012-03-27 ENCOUNTER — Encounter: Payer: Self-pay | Admitting: *Deleted

## 2012-03-27 ENCOUNTER — Encounter (HOSPITAL_COMMUNITY): Payer: Self-pay | Admitting: Anesthesiology

## 2012-03-27 DIAGNOSIS — I4891 Unspecified atrial fibrillation: Secondary | ICD-10-CM | POA: Insufficient documentation

## 2012-03-27 DIAGNOSIS — R931 Abnormal findings on diagnostic imaging of heart and coronary circulation: Secondary | ICD-10-CM

## 2012-03-27 HISTORY — PX: CARDIOVERSION: SHX1299

## 2012-03-27 LAB — LIPID PANEL
Cholesterol: 195 mg/dL (ref 0–200)
HDL: 65 mg/dL (ref >39)
Total CHOL/HDL Ratio: 3 Ratio
Triglycerides: 80 mg/dL (ref <150)

## 2012-03-27 LAB — GC/CHLAMYDIA PROBE AMP, URINE: GC Probe Amp, Urine: NEGATIVE

## 2012-03-27 SURGERY — CARDIOVERSION
Anesthesia: General

## 2012-03-27 MED ORDER — SODIUM CHLORIDE 0.9 % IV SOLN
INTRAVENOUS | Status: DC
  Administered 2012-03-27: 500 mL via INTRAVENOUS

## 2012-03-27 MED ORDER — PROPOFOL 10 MG/ML IV BOLUS
INTRAVENOUS | Status: DC | PRN
  Administered 2012-03-27: 140 mg via INTRAVENOUS

## 2012-03-27 NOTE — CV Procedure (Signed)
DCC:  150 mg propofol given by Dr Lovena Le from anesthesia Rhythm afib rate 120 bpm.  Single biphasic shock delivered Patient converted to NSR rate 72 No immediate neurologic sequelae On Xarelto for 4 weeks.  Successful DCC with restoration of NSR  [employer]

## 2012-03-27 NOTE — Transfer of Care (Signed)
Immediate Anesthesia Transfer of Care Note  Patient: Earl Sanchez  Procedure(s) Performed: Procedure(s) (LRB) with comments: CARDIOVERSION (N/A)  Patient Location: PACU  Anesthesia Type:MAC  Level of Consciousness: awake and alert   Airway & Oxygen Therapy: Patient Spontanous Breathing and Patient connected to nasal cannula oxygen  Post-op Assessment: Report given to PACU RN, Post -op Vital signs reviewed and stable and Patient moving all extremities  Post vital signs: Reviewed and stable  Complications: No apparent anesthesia complications

## 2012-03-27 NOTE — Anesthesia Preprocedure Evaluation (Addendum)
Anesthesia Evaluation  Patient identified by MRN, date of birth, ID band Patient awake    Reviewed: Allergy & Precautions, H&P , NPO status , Patient's Chart, lab work & pertinent test results, reviewed documented beta blocker date and time   Airway Mallampati: I TM Distance: >3 FB Neck ROM: Full    Dental  (+) Teeth Intact and Dental Advisory Given   Pulmonary  breath sounds clear to auscultation        Cardiovascular hypertension, Pt. on medications and Pt. on home beta blockers Rhythm:Regular     Neuro/Psych    GI/Hepatic   Endo/Other    Renal/GU      Musculoskeletal   Abdominal   Peds  Hematology   Anesthesia Other Findings   Reproductive/Obstetrics                           Anesthesia Physical Anesthesia Plan  ASA: III  Anesthesia Plan: General   Post-op Pain Management:    Induction: Intravenous  Airway Management Planned: Mask  Additional Equipment:   Intra-op Plan:   Post-operative Plan:   Informed Consent: I have reviewed the patients History and Physical, chart, labs and discussed the procedure including the risks, benefits and alternatives for the proposed anesthesia with the patient or authorized representative who has indicated his/her understanding and acceptance.   Dental advisory given  Plan Discussed with: CRNA, Anesthesiologist and Surgeon  Anesthesia Plan Comments:         Anesthesia Quick Evaluation

## 2012-03-27 NOTE — Telephone Encounter (Signed)
Pt needs a note to return to work tomorrow he was just released today and when he asked for a note they told him to call us

## 2012-03-27 NOTE — H&P (View-Only) (Signed)
Patient ID: Earl Sanchez, male   DOB: 31-Mar-1960, 52 y.o.   MRN: [id number] HP  IThis 52 y.o. male presents for evaluation of irregular heartbeat. Constant sensation of irregular heartbeat. Rapid heartbeat. No chest pain, SOB, swelling. No dizziness, +fatigue. No SOB. Rare caffeine except on day of onset; did have 1-2 cups of coffee that morning. +DOE after exercise at work. No lack of sleep. No major stressors. Brother who is two years older than patient recently had irregular heart rhythm with cardioversion. Both he and his brother drink too much alcohol. He denies alcoholism but drinks 2-3 beers/day and more on weekends. Relationship betwenn ETOH and afib discussed. Also discussed need for rate control, anticoagulation and conversion strategies. Started on metoprololol last week at urgent care. No bleeding problems Reviewed lab work and Hct and Cr normal  Echo 1/8 EF 40-45% with moderate LAE  ROS: Denies fever, malais, weight loss, blurry vision, decreased visual acuity, cough, sputum, SOB, hemoptysis, pleuritic pain, palpitaitons, heartburn, abdominal pain, melena, lower extremity edema, claudication, or rash.  All other systems reviewed and negative  General: Affect appropriate Healthy:  appears stated age HEENT: normal Neck supple with no adenopathy JVP normal no bruits no thyromegaly Lungs clear with no wheezing and good diaphragmatic motion Heart:  S1/S2 no murmur, no rub, gallop or click PMI normal Abdomen: benighn, BS positve, no tenderness, no AAA no bruit.  No HSM or HJR Distal pulses intact with no bruits No edema Neuro non-focal Skin warm and dry No muscular weakness   Current Outpatient Prescriptions  Medication Sig Dispense Refill  . amLODipine (NORVASC) 10 MG tablet take 1 tablet by mouth once daily  30 tablet  5  . metoprolol (LOPRESSOR) 25 MG tablet Take 1 tablet (25 mg total) by mouth 2 (two) times daily.  60 tablet  1  . Rivaroxaban (XARELTO) 20 MG TABS Take 1 tablet  (20 mg total) by mouth daily.  30 tablet  6  . tadalafil (CIALIS) 20 MG tablet Take 1 tablet (20 mg total) by mouth daily as needed. For E.D.  10 tablet  11   Current Facility-Administered Medications  Medication Dose Route Frequency Provider Last Rate Last Dose  . 0.9 %  sodium chloride infusion  500 mL Intravenous Continuous Mardella Layman, MD        Allergies  Review of patient's allergies indicates no known allergies.  Electrocardiogram:  02/07/12 AFIB OTHERWISE NORMAL  Assessment and Plan

## 2012-03-27 NOTE — Preoperative (Signed)
Beta Blockers   Reason not to administer Beta Blockers:Not Applicable 

## 2012-03-27 NOTE — Anesthesia Postprocedure Evaluation (Signed)
  Anesthesia Post-op Note  Patient: Earl Sanchez  Procedure(s) Performed: Procedure(s) (LRB) with comments: CARDIOVERSION (N/A)  Patient Location: PACU and Endoscopy Unit  Anesthesia Type:General  Level of Consciousness: awake, alert  and oriented  Airway and Oxygen Therapy: Patient Spontanous Breathing  Post-op Pain: none  Post-op Assessment: Post-op Vital signs reviewed  Post-op Vital Signs: Reviewed  Complications: No apparent anesthesia complications

## 2012-03-27 NOTE — Telephone Encounter (Signed)
PER DR NISHAN PT RETURN TO WORK ON 03-28-12  WITHOUT RESTRICTIONS  PT AWARE WILL FAX LETTER TO  [phone] ATTN GEORGE  MARPLE

## 2012-03-27 NOTE — Interval H&P Note (Signed)
History and Physical Interval Note:  03/27/2012 10:18 AM  Earl Sanchez  has presented today for surgery, with the diagnosis of afib  The various methods of treatment have been discussed with the patient and family. After consideration of risks, benefits and other options for treatment, the patient has consented to  Procedure(s) (LRB) with comments: CARDIOVERSION (N/A) as a surgical intervention .  The patient's history has been reviewed, patient examined, no change in status, stable for surgery.  I have reviewed the patient's chart and labs.  Questions were answered to the patient's satisfaction.     Charlton Haws

## 2012-03-27 NOTE — Progress Notes (Signed)
APPT MADE WITH DR Eden Emms FOR  04-29-12 AT 8;00 AM .Earl Sanchez

## 2012-03-28 ENCOUNTER — Encounter (HOSPITAL_COMMUNITY): Payer: Self-pay | Admitting: Cardiovascular Disease

## 2012-03-30 ENCOUNTER — Other Ambulatory Visit: Payer: Self-pay | Admitting: Family Medicine

## 2012-04-11 ENCOUNTER — Telehealth: Payer: Self-pay | Admitting: Internal Medicine

## 2012-04-11 MED ORDER — METOPROLOL TARTRATE 25 MG PO TABS: 25.0000 mg | ORAL_TABLET | Freq: Two times a day (BID) | ORAL | Status: DC

## 2012-04-11 NOTE — Telephone Encounter (Signed)
done

## 2012-04-29 ENCOUNTER — Ambulatory Visit: Payer: 59 | Admitting: Cardiovascular Disease

## 2012-05-22 ENCOUNTER — Other Ambulatory Visit: Payer: Self-pay | Admitting: Family Medicine

## 2012-06-21 ENCOUNTER — Encounter: Payer: Self-pay | Admitting: Cardiovascular Disease

## 2013-02-10 ENCOUNTER — Other Ambulatory Visit: Payer: Self-pay | Admitting: Family Medicine

## 2013-02-10 NOTE — Telephone Encounter (Signed)
IS THIS OKAY 

## 2013-02-18 ENCOUNTER — Encounter: Payer: Self-pay | Admitting: Family Medicine

## 2013-02-18 ENCOUNTER — Ambulatory Visit (INDEPENDENT_AMBULATORY_CARE_PROVIDER_SITE_OTHER): Payer: 59 | Admitting: Family Medicine

## 2013-02-18 VITALS — BP 124/80 | HR 78 | Wt 177.0 lb

## 2013-02-18 DIAGNOSIS — L03019 Cellulitis of unspecified finger: Secondary | ICD-10-CM

## 2013-02-18 DIAGNOSIS — IMO0001 Reserved for inherently not codable concepts without codable children: Secondary | ICD-10-CM

## 2013-02-18 NOTE — Progress Notes (Signed)
   Subjective:    Patient ID: Earl Sanchez, male    DOB: 1960/10/27, 52 y.o.   MRN: [id number]  HPI He complains of slight discomfort and swelling to the medial aspect of the fourth finger nail.   Review of Systems     Objective:   Physical Exam The medial aspect of the fourth finger nail is red, swollen and tender. No tenderness to the flexor surface of the finger.       Assessment & Plan:  Paronychia of fourth finger of right hand  I explained that at this time is not ready to have an incision made. Recommended heat and returning here in the next day or 2. Explained that I did not think an antibiotic would be useful.

## 2013-02-18 NOTE — Patient Instructions (Signed)
Paronychia Paronychia is an inflammatory reaction involving the folds of the skin surrounding the fingernail. This is commonly caused by an infection in the skin around a nail. The most common cause of paronychia is frequent wetting of the hands (as seen with bartenders, food servers, nurses or others who wet their hands). This makes the skin around the fingernail susceptible to infection by bacteria (germs) or fungus. Other predisposing factors are:  Aggressive manicuring.  Nail biting.  Thumb sucking. The most common cause is a staphylococcal (a type of germ) infection, or a fungal (Candida) infection. When caused by a germ, it usually comes on suddenly with redness, swelling, pus and is often painful. It may get under the nail and form an abscess (collection of pus), or form an abscess around the nail. If the nail itself is infected with a fungus, the treatment is usually prolonged and may require oral medicine for up to one year. Your caregiver will determine the length of time treatment is required. The paronychia caused by bacteria (germs) may largely be avoided by not pulling on hangnails or picking at cuticles. When the infection occurs at the tips of the finger it is called felon. When the cause of paronychia is from the herpes simplex virus (HSV) it is called herpetic whitlow. TREATMENT  When an abscess is present treatment is often incision and drainage. This means that the abscess must be cut open so the pus can get out. When this is done, the following home care instructions should be followed. HOME CARE INSTRUCTIONS   It is important to keep the affected fingers very dry. Rubber or plastic gloves over cotton gloves should be used whenever the hand must be placed in water.  Keep wound clean, dry and dressed as suggested by your caregiver between warm soaks or warm compresses.  Soak in warm water for fifteen to twenty minutes three to four times per day for bacterial infections. Fungal  infections are very difficult to treat, so often require treatment for long periods of time.  For bacterial (germ) infections take antibiotics (medicine which kill germs) as directed and finish the prescription, even if the problem appears to be solved before the medicine is gone.  Only take over-the-counter or prescription medicines for pain, discomfort, or fever as directed by your caregiver. SEEK IMMEDIATE MEDICAL CARE IF:  You have redness, swelling, or increasing pain in the wound.  You notice pus coming from the wound.  You have a fever.  You notice a bad smell coming from the wound or dressing. Document Released: 08/16/2000 Document Revised: 05/15/2011 Document Reviewed: 04/17/2008 [employer] Patient Information 2014 [employer].  

## 2013-03-01 ENCOUNTER — Other Ambulatory Visit: Payer: Self-pay | Admitting: Family Medicine

## 2013-09-15 ENCOUNTER — Other Ambulatory Visit: Payer: Self-pay | Admitting: Family Medicine

## 2014-03-21 ENCOUNTER — Other Ambulatory Visit: Payer: Self-pay | Admitting: Family Medicine

## 2014-04-16 ENCOUNTER — Encounter: Payer: Self-pay | Admitting: Family Medicine

## 2014-04-16 ENCOUNTER — Ambulatory Visit (INDEPENDENT_AMBULATORY_CARE_PROVIDER_SITE_OTHER): Payer: 59 | Admitting: Family Medicine

## 2014-04-16 VITALS — BP 150/90 | Wt 170.0 lb

## 2014-04-16 DIAGNOSIS — R591 Generalized enlarged lymph nodes: Secondary | ICD-10-CM

## 2014-04-16 DIAGNOSIS — R040 Epistaxis: Secondary | ICD-10-CM

## 2014-04-16 DIAGNOSIS — R599 Enlarged lymph nodes, unspecified: Secondary | ICD-10-CM

## 2014-04-16 NOTE — Progress Notes (Signed)
   Subjective:    Patient ID: Earl Sanchez, male    DOB: [birth year], 54 y.o.   MRN: [id number]  HPI He is here for evaluation of a knot on the right side of his neck just below his ear x 4 days. He denies fever, chills, ear pain, or ST. He states he does not feel sick. He also complains of nose bleeds from his L nare for the past 2 weeks that he is able to stop relatively easily by applying pressure.       Review of Systems  All other systems reviewed and are negative.      Objective:   Physical Exam Alert and in no distress. Tympanic membranes and canals are normal. Superficial blood vessels noted in bilateral nares. Throat is clear. Tonsils are normal. Neck is supple with a slightly enlarged,1.5 cm right subauricular node. No other node enlargement. Cardiac exam shows a regular rate and rhythm. Lungs are clear to auscultation.        Assessment & Plan:   Enlarged lymph nodes  Epistaxis  I reassured him that the lymph node was nothing to worry about He will let me know if the subauricular lymph node gets any bigger. Also encouraged him to contact me regarding  the nosebleeds if they become more bothersome.

## 2014-04-17 ENCOUNTER — Ambulatory Visit: Payer: Self-pay | Admitting: Medical

## 2014-05-11 ENCOUNTER — Encounter: Payer: Self-pay | Admitting: Family Medicine

## 2014-05-11 ENCOUNTER — Ambulatory Visit (INDEPENDENT_AMBULATORY_CARE_PROVIDER_SITE_OTHER): Payer: 59 | Admitting: Family Medicine

## 2014-05-11 VITALS — BP 142/98 | HR 70 | Ht 68.0 in | Wt 174.0 lb

## 2014-05-11 DIAGNOSIS — F1020 Alcohol dependence, uncomplicated: Secondary | ICD-10-CM | POA: Diagnosis not present

## 2014-05-11 DIAGNOSIS — M459 Ankylosing spondylitis of unspecified sites in spine: Secondary | ICD-10-CM

## 2014-05-11 DIAGNOSIS — Z209 Contact with and (suspected) exposure to unspecified communicable disease: Secondary | ICD-10-CM | POA: Diagnosis not present

## 2014-05-11 DIAGNOSIS — Z0000 Encounter for general adult medical examination without abnormal findings: Secondary | ICD-10-CM

## 2014-05-11 DIAGNOSIS — N529 Male erectile dysfunction, unspecified: Secondary | ICD-10-CM

## 2014-05-11 DIAGNOSIS — I10 Essential (primary) hypertension: Secondary | ICD-10-CM

## 2014-05-11 DIAGNOSIS — Z8679 Personal history of other diseases of the circulatory system: Secondary | ICD-10-CM

## 2014-05-11 DIAGNOSIS — Z23 Encounter for immunization: Secondary | ICD-10-CM

## 2014-05-11 LAB — LIPID PANEL
Cholesterol: 259 mg/dL — ABNORMAL HIGH (ref 0–200)
HDL: 101 mg/dL (ref >=40)
LDL CALC: 145 mg/dL — AB (ref 0–99)
TRIGLYCERIDES: 67 mg/dL (ref <150)
Total CHOL/HDL Ratio: 2.6 Ratio
VLDL: 13 mg/dL (ref 0–40)

## 2014-05-11 LAB — COMPREHENSIVE METABOLIC PANEL
ALBUMIN: 4.7 g/dL (ref 3.5–5.2)
ALT: 12 U/L (ref 0–53)
AST: 20 U/L (ref 0–37)
Alkaline Phosphatase: 63 U/L (ref 39–117)
BILIRUBIN TOTAL: 1 mg/dL (ref 0.2–1.2)
BUN: 11 mg/dL (ref 6–23)
CHLORIDE: 100 meq/L (ref 96–112)
CO2: 29 meq/L (ref 19–32)
Calcium: 9.7 mg/dL (ref 8.4–10.5)
Creat: 0.95 mg/dL (ref 0.50–1.35)
Glucose, Bld: 105 mg/dL — ABNORMAL HIGH (ref 70–99)
POTASSIUM: 4.4 meq/L (ref 3.5–5.3)
SODIUM: 138 meq/L (ref 135–145)
TOTAL PROTEIN: 8 g/dL (ref 6.0–8.3)

## 2014-05-11 LAB — CBC WITH DIFFERENTIAL/PLATELET
BASOS ABS: 0 10*3/uL (ref 0.0–0.1)
BASOS PCT: 0 % (ref 0–1)
EOS PCT: 1 % (ref 0–5)
Eosinophils Absolute: 0 10*3/uL (ref 0.0–0.7)
HCT: 45 % (ref 39.0–52.0)
HEMOGLOBIN: 15.4 g/dL (ref 13.0–17.0)
LYMPHS ABS: 1.6 10*3/uL (ref 0.7–4.0)
Lymphocytes Relative: 33 % (ref 12–46)
MCH: 29.9 pg (ref 26.0–34.0)
MCHC: 34.2 g/dL (ref 30.0–36.0)
MCV: 87.4 fL (ref 78.0–100.0)
MONOS PCT: 10 % (ref 3–12)
MPV: 8.6 fL (ref 8.6–12.4)
Monocytes Absolute: 0.5 10*3/uL (ref 0.1–1.0)
Neutro Abs: 2.6 10*3/uL (ref 1.7–7.7)
Neutrophils Relative %: 56 % (ref 43–77)
Platelets: 231 10*3/uL (ref 150–400)
RBC: 5.15 MIL/uL (ref 4.22–5.81)
RDW: 14.3 % (ref 11.5–15.5)
WBC: 4.7 10*3/uL (ref 4.0–10.5)

## 2014-05-11 LAB — POCT URINALYSIS DIPSTICK
Bilirubin, UA: NEGATIVE
Glucose, UA: NEGATIVE
Ketones, UA: NEGATIVE
LEUKOCYTES UA: NEGATIVE
NITRITE UA: NEGATIVE
PH UA: 6
PROTEIN UA: NEGATIVE
RBC UA: NEGATIVE
Spec Grav, UA: >=1.03
UROBILINOGEN UA: NEGATIVE

## 2014-05-11 NOTE — Progress Notes (Signed)
   Subjective:    Patient ID: Earl Sanchez, male    DOB: [birth year], 54 y.o.   MRN: [id number]  HPI He is here for complete examination. He does have hypertension and is on medication for this. He also has ED and does take Cialis for this. He is having no difficulty with his underlying ankylosing spondylitis. He would like to be STD tested. He does use condoms. He has a previous history of atrial fibrillation was probably related to alcohol use. He does admit to drinking 3 or 4 beers per night and plans to cut back on this. His work is going well. Family and social history were reviewed and are unchanged. Immunizations and health maintenance was reviewed. His father died recently in his 89Y of complications from prostate cancer.    Review of Systems  All other systems reviewed and are negative.      Objective:   Physical Exam BP 142/98 mmHg  Pulse 70  Ht 5\' 8"  (1.727 m)  Wt 174 lb (78.926 kg)  BMI 26.46 kg/m2  General Appearance:    Alert, cooperative, no distress, appears stated age  Head:    Normocephalic, without obvious abnormality, atraumatic  Eyes:    PERRL, conjunctiva/corneas clear, EOM's intact, fundi    benign  Ears:    Normal TM's and external ear canals  Nose:   Nares normal, mucosa normal, no drainage or sinus   tenderness  Throat:   Lips, mucosa, and tongue normal; teeth and gums normal  Neck:   Supple, no lymphadenopathy;  thyroid:  no   enlargement/tenderness/nodules;  no carotid   bruit or JVD  Back:    Spine nontender, no curvature, ROM normal, no CVA     tenderness  Lungs:     Clear to auscultation bilaterally without wheezes, rales or     ronchi; respirations unlabored  Chest Wall:    No tenderness or deformity   Heart:    Regular rate and rhythm, S1 and S2 normal, no murmur, rub   or gallop  Breast Exam:    No chest wall tenderness, masses or gynecomastia  Abdomen:     Soft, non-tender, nondistended, normoactive bowel sounds,    no masses, no  hepatosplenomegaly        Extremities:   No clubbing, cyanosis or edema  Pulses:   2+ and symmetric all extremities  Skin:   Skin color, texture, turgor normal, no rashes or lesions  Lymph nodes:   Cervical, supraclavicular, and axillary nodes normal  Neurologic:   CNII-XII intact, normal strength, sensation and gait; reflexes 2+ and symmetric throughout          Psych:   Normal mood, affect, hygiene and grooming.          Assessment & Plan:  Routine general medical examination at a health care facility - Plan: POCT urinalysis dipstick, CBC with Differential/Platelet, Comprehensive metabolic panel, Lipid panel, PSA  Essential hypertension  Erectile dysfunction, unspecified erectile dysfunction type  Ankylosing spondylitis  Contact with or exposure to communicable disease - Plan: GC/chlamydia probe amp, urine, HIV antibody, RPR  Immunization due - Plan: Hepatitis B vaccine adult IM  Uncomplicated alcohol dependence  History of atrial fibrillation  I discussed his blood pressure and recommended that he cut back on alcohol as well as increase his physical activity. He will attempt to do this and recheck in about 2 months. We also discussed his risk of prostate cancer. His immunizations were updated.

## 2014-05-12 LAB — GC/CHLAMYDIA PROBE AMP, URINE
CHLAMYDIA, SWAB/URINE, PCR: NEGATIVE
GC PROBE AMP, URINE: NEGATIVE

## 2014-05-12 LAB — RPR

## 2014-05-12 LAB — HIV ANTIBODY (ROUTINE TESTING W REFLEX): HIV 1&2 Ab, 4th Generation: NONREACTIVE

## 2014-05-12 LAB — PSA: PSA: 1.92 ng/mL (ref <=4.00)

## 2014-09-20 ENCOUNTER — Other Ambulatory Visit: Payer: Self-pay | Admitting: Family Medicine

## 2015-01-22 ENCOUNTER — Encounter: Payer: Self-pay | Admitting: Family Medicine

## 2015-03-10 ENCOUNTER — Encounter: Payer: Self-pay | Admitting: Family Medicine

## 2015-03-10 ENCOUNTER — Ambulatory Visit (INDEPENDENT_AMBULATORY_CARE_PROVIDER_SITE_OTHER): Payer: 59 | Admitting: Family Medicine

## 2015-03-10 VITALS — BP 150/100 | Wt 173.0 lb

## 2015-03-10 DIAGNOSIS — R519 Headache, unspecified: Secondary | ICD-10-CM

## 2015-03-10 DIAGNOSIS — I10 Essential (primary) hypertension: Secondary | ICD-10-CM | POA: Diagnosis not present

## 2015-03-10 DIAGNOSIS — R51 Headache: Secondary | ICD-10-CM

## 2015-03-10 MED ORDER — LISINOPRIL 10 MG PO TABS: 10.0000 mg | ORAL_TABLET | Freq: Every day | ORAL | Status: DC

## 2015-03-10 NOTE — Progress Notes (Signed)
   Subjective:    Patient ID: Earl Sanchez, male    DOB: Dec 04, 1960, 55 y.o.   MRN: [id number]  HPI Approximately 2 weeks ago he had the onset of sharp sudden left temporal pain that lasted several seconds and then went away. He then felt a similar episode on the right side as well as a tingling sensation. He also complains of fleeting muscle aches and pains in his shoulders and upper back area. No blurred vision, double vision, nausea, vomiting, sinus congestion, earache. He's noted no weakness.   Review of Systems     Objective:   Physical Exam Alert and in no distress. EOMI. Other cranial nerves grossly intact. Normal DTRs and normal cerebellar. Tympanic membranes and canals are normal. Pharyngeal area is normal. Neck is supple without adenopathy or thyromegaly. Cardiac exam shows a regular sinus rhythm without murmurs or gallops. Lungs are clear to auscultation.        Assessment & Plan:  Essential hypertension - Plan: lisinopril (PRINIVIL,ZESTRIL) 10 MG tablet  Headache, unspecified headache type  his blood pressure is not under adequate control and I will therefore add lisinopril. Discussed possible side effects of this. History turned here in one month for recheck on that. No particular therapy for the headache as it does not seem to be related in particular. He will pay attention to this and if this reoccurs, let me know.

## 2015-04-12 ENCOUNTER — Encounter: Payer: Self-pay | Admitting: Family Medicine

## 2015-04-12 ENCOUNTER — Ambulatory Visit (INDEPENDENT_AMBULATORY_CARE_PROVIDER_SITE_OTHER): Payer: 59 | Admitting: Family Medicine

## 2015-04-12 VITALS — BP 139/82 | HR 63 | Wt 172.0 lb

## 2015-04-12 DIAGNOSIS — I10 Essential (primary) hypertension: Secondary | ICD-10-CM

## 2015-04-12 MED ORDER — LISINOPRIL 10 MG PO TABS: 10.0000 mg | ORAL_TABLET | Freq: Every day | ORAL | Status: DC

## 2015-04-12 NOTE — Progress Notes (Signed)
   Subjective:    Patient ID: Earl Sanchez, male    DOB: 1960-05-20, 55 y.o.   MRN: [id number]  HPI He is here for a recheck. Since his last visit he did stop his amlodipine thinking I was switching to only one medication. Presently he is on lisinopril and having no difficulty with that.   Review of Systems     Objective:   Physical Exam Alert and in no distress. Blood pressure is recorded.       Assessment & Plan:  Essential hypertension - Plan: lisinopril (PRINIVIL,ZESTRIL) 10 MG tablet He will continue on this regimen. I explained that in the future we might indeed need to add another medication to his regimen. He is comfortable with this. Recheck here in about 6 months.

## 2016-03-08 ENCOUNTER — Telehealth: Payer: Self-pay | Admitting: Family Medicine

## 2016-03-08 NOTE — Telephone Encounter (Signed)
Patient is going back to [place] and wants to know If he has finished up getting all the shots he needs States that he received some that last time he went but not sure if he received all or if there is any more that he needs

## 2016-03-08 NOTE — Telephone Encounter (Signed)
Recommendations printed and placed on your desk. Victorino December

## 2016-03-08 NOTE — Telephone Encounter (Signed)
Check with the CDC website and see what he will need for [place]

## 2016-03-09 NOTE — Telephone Encounter (Signed)
Let him know that he is up to date on his immunizations.

## 2016-03-09 NOTE — Telephone Encounter (Signed)
Per review with CDC website and calling [facility]- Pt does not Malaria. Typhoid is recommended, but patientt states he did not get this last time he went to [place] 3 yrs ago.   Do you recommend the Typhoid vaccine to patient?

## 2016-03-09 NOTE — Telephone Encounter (Signed)
[facility] has been handling this

## 2016-03-09 NOTE — Telephone Encounter (Signed)
Pt.notified

## 2016-03-27 ENCOUNTER — Encounter: Payer: Self-pay | Admitting: *Deleted

## 2016-04-10 ENCOUNTER — Encounter: Payer: Self-pay | Admitting: Internal Medicine

## 2016-04-15 DIAGNOSIS — S0501XA Injury of conjunctiva and corneal abrasion without foreign body, right eye, initial encounter: Secondary | ICD-10-CM | POA: Diagnosis not present

## 2016-05-02 ENCOUNTER — Other Ambulatory Visit: Payer: Self-pay | Admitting: Family Medicine

## 2016-05-02 DIAGNOSIS — I10 Essential (primary) hypertension: Secondary | ICD-10-CM

## 2016-07-31 DIAGNOSIS — Z0100 Encounter for examination of eyes and vision without abnormal findings: Secondary | ICD-10-CM | POA: Diagnosis not present

## 2016-08-03 ENCOUNTER — Other Ambulatory Visit: Payer: Self-pay | Admitting: Family Medicine

## 2016-08-03 DIAGNOSIS — I10 Essential (primary) hypertension: Secondary | ICD-10-CM

## 2016-08-03 NOTE — Telephone Encounter (Signed)
Left message for pt he needs appointment before I can refill med.

## 2016-08-04 ENCOUNTER — Telehealth: Payer: Self-pay | Admitting: Family Medicine

## 2016-08-04 ENCOUNTER — Other Ambulatory Visit: Payer: Self-pay

## 2016-08-04 ENCOUNTER — Telehealth: Payer: Self-pay

## 2016-08-04 DIAGNOSIS — I10 Essential (primary) hypertension: Secondary | ICD-10-CM

## 2016-08-04 MED ORDER — LISINOPRIL 10 MG PO TABS: 10.0000 mg | ORAL_TABLET | Freq: Every day | ORAL | 0 refills | Status: DC

## 2016-08-04 NOTE — Telephone Encounter (Signed)
Left pt message he must make appointment for med check or physical before I can refill med refused med because he has not called back

## 2016-08-04 NOTE — Telephone Encounter (Signed)
Pt called and made a CPE appt for August. Please refill BP meds to [facility] on [address].

## 2016-08-04 NOTE — Telephone Encounter (Signed)
done

## 2016-10-10 ENCOUNTER — Encounter: Payer: Self-pay | Admitting: Family Medicine

## 2016-10-10 ENCOUNTER — Ambulatory Visit (INDEPENDENT_AMBULATORY_CARE_PROVIDER_SITE_OTHER): Payer: 59 | Admitting: Family Medicine

## 2016-10-10 VITALS — BP 140/100 | HR 69 | Ht 68.0 in | Wt 167.0 lb

## 2016-10-10 DIAGNOSIS — Z86010 Personal history of colonic polyps: Secondary | ICD-10-CM | POA: Insufficient documentation

## 2016-10-10 DIAGNOSIS — R319 Hematuria, unspecified: Secondary | ICD-10-CM | POA: Diagnosis not present

## 2016-10-10 DIAGNOSIS — Z209 Contact with and (suspected) exposure to unspecified communicable disease: Secondary | ICD-10-CM | POA: Diagnosis not present

## 2016-10-10 DIAGNOSIS — M457 Ankylosing spondylitis of lumbosacral region: Secondary | ICD-10-CM | POA: Diagnosis not present

## 2016-10-10 DIAGNOSIS — N529 Male erectile dysfunction, unspecified: Secondary | ICD-10-CM | POA: Diagnosis not present

## 2016-10-10 DIAGNOSIS — I10 Essential (primary) hypertension: Secondary | ICD-10-CM

## 2016-10-10 DIAGNOSIS — Z0000 Encounter for general adult medical examination without abnormal findings: Secondary | ICD-10-CM | POA: Diagnosis not present

## 2016-10-10 DIAGNOSIS — Z8679 Personal history of other diseases of the circulatory system: Secondary | ICD-10-CM

## 2016-10-10 LAB — CBC WITH DIFFERENTIAL/PLATELET
BASOS ABS: 0 {cells}/uL (ref 0–200)
Basophils Relative: 0 %
EOS ABS: 48 {cells}/uL (ref 15–500)
Eosinophils Relative: 1 %
HEMATOCRIT: 43.5 % (ref 38.5–50.0)
Hemoglobin: 14.9 g/dL (ref 13.2–17.1)
LYMPHS PCT: 36 %
Lymphs Abs: 1728 cells/uL (ref 850–3900)
MCH: 30 pg (ref 27.0–33.0)
MCHC: 34.3 g/dL (ref 32.0–36.0)
MCV: 87.7 fL (ref 80.0–100.0)
MONO ABS: 480 {cells}/uL (ref 200–950)
MONOS PCT: 10 %
MPV: 8.6 fL (ref 7.5–12.5)
Neutro Abs: 2544 cells/uL (ref 1500–7800)
Neutrophils Relative %: 53 %
Platelets: 222 10*3/uL (ref 140–400)
RBC: 4.96 MIL/uL (ref 4.20–5.80)
RDW: 14.3 % (ref 11.0–15.0)
WBC: 4.8 10*3/uL (ref 4.0–10.5)

## 2016-10-10 LAB — LIPID PANEL
CHOL/HDL RATIO: 2.3 ratio (ref <5.0)
CHOLESTEROL: 234 mg/dL — AB (ref <200)
HDL: 104 mg/dL (ref >40)
LDL Cholesterol: 118 mg/dL — ABNORMAL HIGH (ref <100)
Triglycerides: 59 mg/dL (ref <150)
VLDL: 12 mg/dL (ref <30)

## 2016-10-10 LAB — COMPREHENSIVE METABOLIC PANEL
ALK PHOS: 57 U/L (ref 40–115)
ALT: 12 U/L (ref 9–46)
AST: 19 U/L (ref 10–35)
Albumin: 4.7 g/dL (ref 3.6–5.1)
BUN: 13 mg/dL (ref 7–25)
CALCIUM: 9.7 mg/dL (ref 8.6–10.3)
CHLORIDE: 102 mmol/L (ref 98–110)
CO2: 27 mmol/L (ref 20–32)
Creat: 0.84 mg/dL (ref 0.70–1.33)
Glucose, Bld: 104 mg/dL — ABNORMAL HIGH (ref 65–99)
POTASSIUM: 4.5 mmol/L (ref 3.5–5.3)
Sodium: 138 mmol/L (ref 135–146)
TOTAL PROTEIN: 7.7 g/dL (ref 6.1–8.1)
Total Bilirubin: 1.1 mg/dL (ref 0.2–1.2)

## 2016-10-10 NOTE — Progress Notes (Signed)
Subjective:    Patient ID: Earl Sanchez, male    DOB: [birth year], 56 y.o.   MRN: [id number]  HPI He is here for complete examination. He has enjoyed excellent health and has no major concerns. He would like to be STD tested to be safe. He states he has cut back on his alcohol consumption to 2 or 3 beers per day. He has a history of colonic polyps and is scheduled to get another colonoscopy this year. He does have underlying ankylosing spondylitis but states that he has had no difficulty with back pain. He has a previous history of probable alcohol induced atrial flutter but has had no irregular heartbeats recently. He states he has not used his Cialis in quite some time for his underlying ED. Previous history of hematuria and did have an evaluation for this which was negative. He continues on his lisinopril. Family and social history as well as health maintenance and immunizations was reviewed.   Review of Systems  All other systems reviewed and are negative.      Objective:   Physical Exam BP (!) 140/100   Pulse 69   Ht 5\' 8"  (1.727 m)   Wt 167 lb (75.8 kg)   SpO2 97%   BMI 25.39 kg/m   General Appearance:    Alert, cooperative, no distress, appears stated age  Head:    Normocephalic, without obvious abnormality, atraumatic  Eyes:    PERRL, conjunctiva/corneas clear, EOM's intact, fundi    benign  Ears:    Normal TM's and external ear canals  Nose:   Nares normal, mucosa normal, no drainage or sinus   tenderness  Throat:   Lips, mucosa, and tongue normal; teeth and gums normal  Neck:   Supple, no lymphadenopathy;  thyroid:  no   enlargement/tenderness/nodules; no carotid   bruit or JVD     Lungs:     Clear to auscultation bilaterally without wheezes, rales or     ronchi; respirations unlabored      Heart:    Regular rate and rhythm, S1 and S2 normal, no murmur, rub   or gallop     Abdomen:     Soft, non-tender, nondistended, normoactive bowel sounds,    no masses, no  hepatosplenomegaly  Genitalia:    Normal male external genitalia without lesions.  Testicles without masses.  No inguinal hernias.  Rectal:    Deferred   Extremities:   No clubbing, cyanosis or edema  Pulses:   2+ and symmetric all extremities  Skin:   Skin color, texture, turgor normal, no rashes or lesions  Lymph nodes:   Cervical, supraclavicular, and axillary nodes normal  Neurologic:   CNII-XII intact, normal strength, sensation and gait; reflexes 2+ and symmetric throughout          Psych:   Normal mood, affect, hygiene and grooming.          Assessment & Plan:  Routine general medical examination at a health care facility - Plan: CBC with Differential/Platelet, Comprehensive metabolic panel, Lipid panel  History of colonic polyps  Essential hypertension - Plan: CBC with Differential/Platelet, Comprehensive metabolic panel  Ankylosing spondylitis of lumbosacral region [facility])  History of atrial fibrillation  Erectile dysfunction, unspecified erectile dysfunction type  Hematuria of undiagnosed cause - Plan: CBC with Differential/Platelet, Comprehensive metabolic panel  Contact with or exposure to communicable disease - Plan: HIV antibody, RPR Today's blood pressure was elevated and he is to return in one month for a recheck  on that. Otherwise he seems be doing quite nicely. Did discuss cutting back to 1 or 2 beers

## 2016-10-10 NOTE — Patient Instructions (Signed)
Regular exercise can help your blood pressure and also check into the DASH diet

## 2016-10-11 LAB — RPR

## 2016-10-11 LAB — HIV ANTIBODY (ROUTINE TESTING W REFLEX): HIV 1&2 Ab, 4th Generation: NONREACTIVE

## 2016-11-06 ENCOUNTER — Other Ambulatory Visit: Payer: Self-pay | Admitting: Family Medicine

## 2016-11-06 DIAGNOSIS — I10 Essential (primary) hypertension: Secondary | ICD-10-CM

## 2016-11-20 ENCOUNTER — Ambulatory Visit: Payer: 59 | Admitting: Family Medicine

## 2017-01-17 DIAGNOSIS — Z23 Encounter for immunization: Secondary | ICD-10-CM | POA: Diagnosis not present

## 2017-08-06 ENCOUNTER — Other Ambulatory Visit: Payer: Self-pay | Admitting: Family Medicine

## 2017-08-06 DIAGNOSIS — I10 Essential (primary) hypertension: Secondary | ICD-10-CM

## 2017-08-06 DIAGNOSIS — H2012 Chronic iridocyclitis, left eye: Secondary | ICD-10-CM | POA: Diagnosis not present

## 2017-10-06 ENCOUNTER — Other Ambulatory Visit: Payer: Self-pay | Admitting: Family Medicine

## 2017-10-06 DIAGNOSIS — I10 Essential (primary) hypertension: Secondary | ICD-10-CM

## 2017-10-23 ENCOUNTER — Encounter: Payer: Self-pay | Admitting: Family Medicine

## 2017-10-23 ENCOUNTER — Ambulatory Visit: Payer: 59 | Admitting: Family Medicine

## 2017-10-23 VITALS — BP 142/88 | HR 62 | Temp 98.0°F | Ht 67.5 in | Wt 166.6 lb

## 2017-10-23 DIAGNOSIS — Z125 Encounter for screening for malignant neoplasm of prostate: Secondary | ICD-10-CM | POA: Diagnosis not present

## 2017-10-23 DIAGNOSIS — M457 Ankylosing spondylitis of lumbosacral region: Secondary | ICD-10-CM | POA: Diagnosis not present

## 2017-10-23 DIAGNOSIS — Z23 Encounter for immunization: Secondary | ICD-10-CM

## 2017-10-23 DIAGNOSIS — Z0000 Encounter for general adult medical examination without abnormal findings: Secondary | ICD-10-CM | POA: Diagnosis not present

## 2017-10-23 DIAGNOSIS — Z8679 Personal history of other diseases of the circulatory system: Secondary | ICD-10-CM

## 2017-10-23 DIAGNOSIS — Z86010 Personal history of colonic polyps: Secondary | ICD-10-CM | POA: Diagnosis not present

## 2017-10-23 DIAGNOSIS — Z209 Contact with and (suspected) exposure to unspecified communicable disease: Secondary | ICD-10-CM

## 2017-10-23 DIAGNOSIS — N529 Male erectile dysfunction, unspecified: Secondary | ICD-10-CM

## 2017-10-23 DIAGNOSIS — R319 Hematuria, unspecified: Secondary | ICD-10-CM

## 2017-10-23 DIAGNOSIS — I10 Essential (primary) hypertension: Secondary | ICD-10-CM | POA: Diagnosis not present

## 2017-10-23 LAB — POCT URINALYSIS DIP (PROADVANTAGE DEVICE)
BILIRUBIN UA: NEGATIVE
GLUCOSE UA: NEGATIVE mg/dL
Ketones, POC UA: NEGATIVE mg/dL
Leukocytes, UA: NEGATIVE
NITRITE UA: NEGATIVE
Protein Ur, POC: NEGATIVE mg/dL
SPECIFIC GRAVITY, URINE: 1.02
Urobilinogen, Ur: 3.5
pH, UA: 6 (ref 5.0–8.0)

## 2017-10-23 MED ORDER — LISINOPRIL-HYDROCHLOROTHIAZIDE 10-12.5 MG PO TABS: 1.0000 | ORAL_TABLET | Freq: Every day | ORAL | 3 refills | Status: DC

## 2017-10-23 NOTE — Progress Notes (Signed)
Subjective:    Patient ID: Earl Sanchez, male    DOB: 05-19-1960, 57 y.o.   MRN: [id number]  HPI He is here for complete examination.  He has no particular concerns or complaints.  Does have a previous history of ankylosing spondylitis but has not had any trouble with joint aches or pains.  He also has a previous history of atrial fibrillation which was apparently alcohol related.  He has cut back on his alcohol consumption now having no more than 2 beers per day.  Also has a previous history of hematuria and has had a urologic work-up which was negative.  His ED is causing very little difficulty.  He also has a history of tubular colonic polyps and is past due for follow-up on that.  Continues on his lisinopril for the blood pressure.  He would like to have routine STD testing just to be on the safe side.  He does use condoms and apparently is dating only one woman.  He does not smoke.  Does keep himself in good physical condition.  Currently his father died of what he calls a bone cancer he is unsure exactly what kind.  Otherwise his family and social history as well as health maintenance and immunizations was reviewed   Review of Systems  All other systems reviewed and are negative.      Objective:   Physical Exam BP (!) 142/88 (BP Location: Left Arm, Patient Position: Sitting)   Pulse 62   Temp 98 F (36.7 C)   Ht 5' 7.5" (1.715 m)   Wt 166 lb 9.6 oz (75.6 kg)   SpO2 99%   BMI 25.71 kg/m   General Appearance:    Alert, cooperative, no distress, appears stated age  Head:    Normocephalic, without obvious abnormality, atraumatic  Eyes:    PERRL, conjunctiva/corneas clear, EOM's intact, fundi    benign  Ears:    Normal TM's and external ear canals  Nose:   Nares normal, mucosa normal, no drainage or sinus   tenderness  Throat:   Lips, mucosa, and tongue normal; teeth and gums normal  Neck:   Supple, no lymphadenopathy;  thyroid:  no   enlargement/tenderness/nodules; no carotid  bruit or JVD     Lungs:     Clear to auscultation bilaterally without wheezes, rales or     ronchi; respirations unlabored      Heart:    Regular rate and rhythm, S1 and S2 normal, no murmur, rub   or gallop     Abdomen:     Soft, non-tender, nondistended, normoactive bowel sounds,    no masses, no hepatosplenomegaly  Genitalia:   Deferred  Rectal:   Deferred  Extremities:   No clubbing, cyanosis or edema  Pulses:   2+ and symmetric all extremities  Skin:   Skin color, texture, turgor normal, no rashes or lesions  Lymph nodes:   Cervical, supraclavicular, and axillary nodes normal  Neurologic:   CNII-XII intact, normal strength, sensation and gait; reflexes 2+ and symmetric throughout          Psych:   Normal mood, affect, hygiene and grooming.          Assessment & Plan:  Routine general medical examination at a health care facility - Plan: CBC with Differential/Platelet, Comprehensive metabolic panel, Lipid panel  Ankylosing spondylitis of lumbosacral region [facility])  History of atrial fibrillation  Erectile dysfunction, unspecified erectile dysfunction type  Hematuria of undiagnosed cause  History of  colonic polyps - Plan: Ambulatory referral to Gastroenterology  Essential hypertension - Plan: lisinopril-hydrochlorothiazide (PRINZIDE,ZESTORETIC) 10-12.5 MG tablet, CBC with Differential/Platelet, Comprehensive metabolic panel  Screening for prostate cancer - Plan: PSA  Contact with or exposure to communicable disease - Plan: HIV antibody, GC/Chlamydia Probe Amp, RPR  Need for influenza vaccination - Plan: Flu Vaccine QUAD 6+ mos PF IM (Fluarix Quad PF) Routine blood screening was ordered as well as STD.  He will be given a flu shot.  Referral for GI to follow-up on his colonic polyp.  I will place him on lisinopril/HCTZ and recheck here in 1 month.  He is also to check with his relatives to find out exactly what kind of cancer his father died from.  Explained the fact that  bone cancer can most likely represent metastatic disease rather than primary.

## 2017-10-24 LAB — COMPREHENSIVE METABOLIC PANEL
ALBUMIN: 4.7 g/dL (ref 3.5–5.5)
ALK PHOS: 56 IU/L (ref 39–117)
ALT: 8 IU/L (ref 0–44)
AST: 21 IU/L (ref 0–40)
Albumin/Globulin Ratio: 1.9 (ref 1.2–2.2)
BUN / CREAT RATIO: 16 (ref 9–20)
BUN: 14 mg/dL (ref 6–24)
Bilirubin Total: 1.3 mg/dL — ABNORMAL HIGH (ref 0.0–1.2)
CO2: 23 mmol/L (ref 20–29)
CREATININE: 0.89 mg/dL (ref 0.76–1.27)
Calcium: 9.6 mg/dL (ref 8.7–10.2)
Chloride: 101 mmol/L (ref 96–106)
GFR calc Af Amer: 109 mL/min/{1.73_m2} (ref >59)
GFR calc non Af Amer: 94 mL/min/{1.73_m2} (ref >59)
GLOBULIN, TOTAL: 2.5 g/dL (ref 1.5–4.5)
Glucose: 101 mg/dL — ABNORMAL HIGH (ref 65–99)
Potassium: 4.7 mmol/L (ref 3.5–5.2)
SODIUM: 139 mmol/L (ref 134–144)
Total Protein: 7.2 g/dL (ref 6.0–8.5)

## 2017-10-24 LAB — LIPID PANEL
CHOL/HDL RATIO: 2.9 ratio (ref 0.0–5.0)
CHOLESTEROL TOTAL: 220 mg/dL — AB (ref 100–199)
HDL: 75 mg/dL (ref >39)
LDL CALC: 134 mg/dL — AB (ref 0–99)
Triglycerides: 55 mg/dL (ref 0–149)
VLDL Cholesterol Cal: 11 mg/dL (ref 5–40)

## 2017-10-24 LAB — CBC WITH DIFFERENTIAL/PLATELET
BASOS: 0 %
Basophils Absolute: 0 10*3/uL (ref 0.0–0.2)
EOS (ABSOLUTE): 0 10*3/uL (ref 0.0–0.4)
EOS: 1 %
HEMATOCRIT: 44.7 % (ref 37.5–51.0)
HEMOGLOBIN: 14.9 g/dL (ref 13.0–17.7)
Immature Grans (Abs): 0 10*3/uL (ref 0.0–0.1)
Immature Granulocytes: 0 %
LYMPHS ABS: 1.4 10*3/uL (ref 0.7–3.1)
Lymphs: 39 %
MCH: 29.9 pg (ref 26.6–33.0)
MCHC: 33.3 g/dL (ref 31.5–35.7)
MCV: 90 fL (ref 79–97)
Monocytes Absolute: 0.5 10*3/uL (ref 0.1–0.9)
Monocytes: 13 %
NEUTROS ABS: 1.7 10*3/uL (ref 1.4–7.0)
Neutrophils: 47 %
Platelets: 218 10*3/uL (ref 150–450)
RBC: 4.98 x10E6/uL (ref 4.14–5.80)
RDW: 14.2 % (ref 12.3–15.4)
WBC: 3.6 10*3/uL (ref 3.4–10.8)

## 2017-10-24 LAB — HIV ANTIBODY (ROUTINE TESTING W REFLEX): HIV SCREEN 4TH GENERATION: NONREACTIVE

## 2017-10-24 LAB — GC/CHLAMYDIA PROBE AMP
Chlamydia trachomatis, NAA: NEGATIVE
Neisseria gonorrhoeae by PCR: NEGATIVE

## 2017-10-24 LAB — RPR: RPR: NONREACTIVE

## 2017-10-24 LAB — PSA: Prostate Specific Ag, Serum: 1.8 ng/mL (ref 0.0–4.0)

## 2017-11-13 DIAGNOSIS — Z1211 Encounter for screening for malignant neoplasm of colon: Secondary | ICD-10-CM | POA: Diagnosis not present

## 2017-11-13 DIAGNOSIS — Z86010 Personal history of colonic polyps: Secondary | ICD-10-CM | POA: Diagnosis not present

## 2017-11-13 DIAGNOSIS — K5730 Diverticulosis of large intestine without perforation or abscess without bleeding: Secondary | ICD-10-CM | POA: Diagnosis not present

## 2017-11-22 ENCOUNTER — Emergency Department (HOSPITAL_COMMUNITY): Payer: 59

## 2017-11-22 ENCOUNTER — Ambulatory Visit (HOSPITAL_COMMUNITY)
Admission: EM | Admit: 2017-11-22 | Discharge: 2017-11-22 | Disposition: A | Discharge status: Home / Self Care | Payer: Self-pay

## 2017-11-22 ENCOUNTER — Emergency Department (HOSPITAL_COMMUNITY)
Admission: EM | Admit: 2017-11-22 | Discharge: 2017-11-22 | Disposition: A | Discharge status: Home / Self Care | Payer: 59 | Attending: Emergency Medicine | Admitting: Emergency Medicine

## 2017-11-22 ENCOUNTER — Other Ambulatory Visit: Payer: Self-pay

## 2017-11-22 ENCOUNTER — Encounter (HOSPITAL_COMMUNITY): Payer: Self-pay

## 2017-11-22 DIAGNOSIS — R51 Headache: Secondary | ICD-10-CM | POA: Diagnosis present

## 2017-11-22 DIAGNOSIS — Z79899 Other long term (current) drug therapy: Secondary | ICD-10-CM | POA: Diagnosis not present

## 2017-11-22 DIAGNOSIS — I10 Essential (primary) hypertension: Secondary | ICD-10-CM

## 2017-11-22 LAB — CBC WITH DIFFERENTIAL/PLATELET
Abs Immature Granulocytes: 0 10*3/uL (ref 0.0–0.1)
BASOS ABS: 0 10*3/uL (ref 0.0–0.1)
Basophils Relative: 0 %
Eosinophils Absolute: 0 10*3/uL (ref 0.0–0.7)
Eosinophils Relative: 1 %
HCT: 45.4 % (ref 39.0–52.0)
HEMOGLOBIN: 14.9 g/dL (ref 13.0–17.0)
IMMATURE GRANULOCYTES: 0 %
LYMPHS ABS: 1.3 10*3/uL (ref 0.7–4.0)
LYMPHS PCT: 30 %
MCH: 29.7 pg (ref 26.0–34.0)
MCHC: 32.8 g/dL (ref 30.0–36.0)
MCV: 90.4 fL (ref 78.0–100.0)
Monocytes Absolute: 0.5 10*3/uL (ref 0.1–1.0)
Monocytes Relative: 12 %
NEUTROS ABS: 2.5 10*3/uL (ref 1.7–7.7)
NEUTROS PCT: 57 %
Platelets: 211 10*3/uL (ref 150–400)
RBC: 5.02 MIL/uL (ref 4.22–5.81)
RDW: 12.7 % (ref 11.5–15.5)
WBC: 4.4 10*3/uL (ref 4.0–10.5)

## 2017-11-22 LAB — BASIC METABOLIC PANEL
ANION GAP: 11 (ref 5–15)
BUN: 12 mg/dL (ref 6–20)
CALCIUM: 9.6 mg/dL (ref 8.9–10.3)
CHLORIDE: 102 mmol/L (ref 98–111)
CO2: 26 mmol/L (ref 22–32)
CREATININE: 0.81 mg/dL (ref 0.61–1.24)
GFR calc non Af Amer: >60 mL/min (ref >60)
Glucose, Bld: 113 mg/dL — ABNORMAL HIGH (ref 70–99)
Potassium: 4.3 mmol/L (ref 3.5–5.1)
SODIUM: 139 mmol/L (ref 135–145)

## 2017-11-22 LAB — I-STAT TROPONIN, ED: TROPONIN I, POC: 0 ng/mL (ref 0.00–0.08)

## 2017-11-22 MED ORDER — SODIUM CHLORIDE 0.9 % IV BOLUS: 1000.0000 mL | Freq: Once | INTRAVENOUS | Status: DC

## 2017-11-22 MED ORDER — LABETALOL HCL 5 MG/ML IV SOLN
10.0000 mg | Freq: Once | INTRAVENOUS | Status: AC
  Administered 2017-11-22: 10 mg via INTRAVENOUS
  Filled 2017-11-22: qty 4

## 2017-11-22 MED ORDER — ACETAMINOPHEN 325 MG PO TABS
650.0000 mg | ORAL_TABLET | Freq: Once | ORAL | Status: AC
  Administered 2017-11-22: 325 mg via ORAL
  Filled 2017-11-22: qty 2

## 2017-11-22 MED ORDER — HYDROCHLOROTHIAZIDE 25 MG PO TABS
12.5000 mg | ORAL_TABLET | Freq: Once | ORAL | Status: AC
  Administered 2017-11-22: 12.5 mg via ORAL
  Filled 2017-11-22: qty 1

## 2017-11-22 NOTE — ED Notes (Signed)
Patient verbalizes understanding of discharge instructions. Opportunity for questioning and answers were provided. Armband removed by staff, pt discharged from ED to home ambulatory.  

## 2017-11-22 NOTE — Discharge Instructions (Signed)
Please take the lisinopril-hydrochlorothiazide medication that was prescribed by your doctor as directed. Please contact your primary care provider tomorrow to set up an appointment. Return to ED for worsening symptoms including worsening headache, vision changes, chest pain, shortness of breath, trouble walking.

## 2017-11-22 NOTE — ED Triage Notes (Signed)
Pt presents with dizziness and headache, took BP at work with reading 195/100.  Pt has taken medication this morning, is supposed to start lisinopril but hasn't fill script yet.

## 2017-11-22 NOTE — ED Notes (Signed)
Pt went outside to move car because he originally parked illegally. Informed of room status and will return after.

## 2017-11-22 NOTE — ED Provider Notes (Signed)
[facility] EMERGENCY DEPARTMENT Provider Note   CSN: [id number] Arrival date & time: 11/22/17  1125     History   Chief Complaint No chief complaint on file.   HPI Earl Sanchez is a 57 y.o. male with a past medical history of hypertension, remote history of atrial fibrillation with last cardioversion in 2014, presents to ED for evaluation of high blood pressure, mild headache and dizziness.  States that he has been monitoring his blood pressures at home and work daily and usually reads 196Q systolic.  States that he checked it this morning when he had a slight headache and dizziness.  States that his blood pressure has never been as high as 229N systolic.  Patient seen and evaluated by his PCP last month and was told to change his lisinopril 10 mg to lisinopril HCTZ 10-12.5.  He has not made this change yet.  He denies any chest pain, shortness of breath, numbness in arms or legs, changes to gait, vision changes, fever, vomiting.  HPI  Past Medical History:  Diagnosis Date  . Ankylosing spondylitis ([place])   . Erectile dysfunction   . Hypertension   . Microscopic hematuria     Patient Active Problem List   Diagnosis Date Noted  . History of colonic polyps 10/10/2016  . History of atrial fibrillation 02/23/2012  . Hypertension 09/12/2010  . ED (erectile dysfunction) 09/12/2010  . Ankylosing spondylitis ([place]) 09/12/2010  . Hematuria of undiagnosed cause 09/12/2010    Past Surgical History:  Procedure Laterality Date  . CARDIOVERSION  03/27/2012   Procedure: CARDIOVERSION;  Surgeon: Josue Hector, MD;  Location: [facility] ENDOSCOPY;  Service: Cardiovascular;  Laterality: N/A;  . [place]   left neck/benign        Home Medications    Prior to Admission medications   Medication Sig Start Date End Date Taking? Authorizing Provider  lisinopril (PRINIVIL,ZESTRIL) 10 MG tablet TAKE 1 TABLET BY MOUTH ONCE DAILY Patient taking differently: Take  10 mg by mouth daily.  10/08/17  Yes Denita Lung, MD  lisinopril-hydrochlorothiazide (PRINZIDE,ZESTORETIC) 10-12.5 MG tablet Take 1 tablet by mouth daily. Patient not taking: Reported on 11/22/2017 10/23/17   Denita Lung, MD    Family History Family History  Problem Relation Age of Onset  . Arthritis Mother   . Hypertension Mother   . Colon polyps Brother   . Hypertension Father   . Cancer Father     Social History Social History   Tobacco Use  . Smoking status: Never Smoker  . Smokeless tobacco: Never Used  Substance Use Topics  . Alcohol use: Yes    Alcohol/week: 14.0 standard drinks    Types: 14 Cans of beer per week  . Drug use: No     Allergies   Patient has no known allergies.   Review of Systems Review of Systems  Constitutional: Negative for appetite change, chills and fever.  HENT: Negative for ear pain, rhinorrhea, sneezing and sore throat.   Eyes: Negative for photophobia and visual disturbance.  Respiratory: Negative for cough, chest tightness, shortness of breath and wheezing.   Cardiovascular: Negative for chest pain and palpitations.  Gastrointestinal: Negative for abdominal pain, blood in stool, constipation, diarrhea, nausea and vomiting.  Genitourinary: Negative for dysuria, hematuria and urgency.  Musculoskeletal: Negative for myalgias.  Skin: Negative for rash.  Neurological: Positive for dizziness and headaches. Negative for weakness and light-headedness.     Physical Exam Updated Vital Signs BP Marland Kitchen)  172/106 (BP Location: Right Arm)   Pulse (!) 58   Temp 98 F (36.7 C) (Oral)   Resp 16   Ht 5\' 8"  (1.727 m)   Wt 76.2 kg   SpO2 98%   BMI 25.54 kg/m   Physical Exam  Constitutional: He is oriented to person, place, and time. He appears well-developed and well-nourished. No distress.  HENT:  Head: Normocephalic and atraumatic.  Nose: Nose normal.  Eyes: Pupils are equal, round, and reactive to light. Conjunctivae and EOM are normal.  Right eye exhibits no discharge. Left eye exhibits no discharge. No scleral icterus.  Neck: Normal range of motion. Neck supple.  Cardiovascular: Normal rate, regular rhythm, normal heart sounds and intact distal pulses. Exam reveals no gallop and no friction rub.  No murmur heard. Pulmonary/Chest: Effort normal and breath sounds normal. No respiratory distress.  Abdominal: Soft. Bowel sounds are normal. He exhibits no distension. There is no tenderness. There is no guarding.  Musculoskeletal: Normal range of motion. He exhibits no edema.  Neurological: He is alert and oriented to person, place, and time. No cranial nerve deficit or sensory deficit. He exhibits normal muscle tone. Coordination normal.  Pupils reactive. No facial asymmetry noted. Cranial nerves appear grossly intact. Sensation intact to light touch on face, BUE and BLE. Strength 5/5 in BUE and BLE.   Skin: Skin is warm and dry. No rash noted.  Psychiatric: He has a normal mood and affect.  Nursing note and vitals reviewed.    ED Treatments / Results  Labs (all labs ordered are listed, but only abnormal results are displayed) Labs Reviewed  BASIC METABOLIC PANEL - Abnormal; Notable for the following components:      Result Value   Glucose, Bld 113 (*)    All other components within normal limits  CBC WITH DIFFERENTIAL/PLATELET  I-STAT TROPONIN, ED    EKG EKG Interpretation  Date/Time:  Thursday November 22 2017 11:45:44 EDT Ventricular Rate:  60 PR Interval:  146 QRS Duration: 88 QT Interval:  370 QTC Calculation: 370 R Axis:   60 Text Interpretation:  Normal sinus rhythm Minimal voltage criteria for LVH, may be normal variant Septal infarct , age undetermined Abnormal ECG T wave inversions in III more pronounced but otherwise similar to previous Confirmed by Theotis Burrow [phone]) on 11/22/2017 2:47:53 PM   Radiology Ct Head Wo Contrast  Result Date: 11/22/2017 CLINICAL DATA:  Dizziness and headaches EXAM:  CT HEAD WITHOUT CONTRAST TECHNIQUE: Contiguous axial images were obtained from the base of the skull through the vertex without intravenous contrast. COMPARISON:  None. FINDINGS: Brain: No evidence of acute infarction, hemorrhage, hydrocephalus, extra-axial collection or mass lesion/mass effect. Vascular: No hyperdense vessel or unexpected calcification. Skull: No osseous abnormality. Sinuses/Orbits: Visualized paranasal sinuses are clear. Visualized mastoid sinuses are clear. Visualized orbits demonstrate no focal abnormality. Other: None IMPRESSION: No acute intracranial pathology. Electronically Signed   By: Kathreen Devoid   On: 11/22/2017 17:59    Procedures Procedures (including critical care time)  Medications Ordered in ED Medications  acetaminophen (TYLENOL) tablet 650 mg (325 mg Oral Given 11/22/17 1606)  hydrochlorothiazide (HYDRODIURIL) tablet 12.5 mg (12.5 mg Oral Given 11/22/17 1605)  labetalol (NORMODYNE,TRANDATE) injection 10 mg (10 mg Intravenous Given 11/22/17 1842)     Initial Impression / Assessment and Plan / ED Course  I have reviewed the triage vital signs and the nursing notes.  Pertinent labs & imaging results that were available during my care of the patient were reviewed  by me and considered in my medical decision making (see chart for details).  Clinical Course as of Nov 22 2036  Thu Nov 22, 2017  1718 Patient still hypertensive to 197/110.  Will obtain CT of the head to rule out PRES.   [HK]    Clinical Course User Index [HK] Delia Heady, PA-C    57 year old male with past medical history of hypertension, remote history of atrial fibrillation with last cardioversion in 2014 presents to ED for evaluation of high blood pressure.  He reports a mild headache and dizziness that began today.  He checked his blood pressure at work and saw that it was 517 systolic.  Last month, his PCP switched him from lisinopril 10 mg to lisinopril HCTZ 10-12.5 mg.  He has not made this  change yet because he states that "I wanted to finish my other medicine before he started this new one."  He states that systolics are usually in 001V.  On exam there are no neurological deficits noted.  He denies any chest pain, shortness of breath.  He is amatory with a normal gait.  He is hypertensive to 494 systolic.  CBC, BMP, troponin unremarkable.  EKG shows normal sinus rhythm with T wave inversions more pronounced in V3 but otherwise similar to prior tracings.  He continues to deny chest pain or shortness of breath.  CT of the head was negative for acute abnormality.  Patient was given dose of HCTZ 12.5 mg and 10 mg of labetalol with mild improvement in his symptoms.  He does state that his headache has resolved with time and Tylenol.  Other vital signs remain within normal limits.  I had a discussion with the patient regarding options as far as admission for ongoing hypertension versus discharge home with PCP follow-up.  Patient does not feel that he needs to be admitted for his blood pressure.  He is reassured that his symptoms have improved.  He believes that the cause of his hypertension is being nervous about being here in the ED.  I feel it is reasonable for him to be discharged home with continuing the new change in medication that his PCP gave him.  Will advise him to return to ED for any severe worsening symptoms. Patient discussed with my attending, Dr. Rex Kras.  Portions of this note were generated with Lobbyist. Dictation errors may occur despite best attempts at proofreading.   Final Clinical Impressions(s) / ED Diagnoses   Final diagnoses:  Hypertension, unspecified type    ED Discharge Orders    None       Delia Heady, PA-C 11/22/17 2039    Little, Wenda Overland, MD 11/23/17 1013

## 2017-11-22 NOTE — ED Notes (Signed)
Pt not in bed, unable to find pt.

## 2017-11-23 ENCOUNTER — Encounter: Payer: Self-pay | Admitting: Family Medicine

## 2017-11-23 ENCOUNTER — Ambulatory Visit: Payer: 59 | Admitting: Family Medicine

## 2017-11-23 VITALS — BP 146/94 | HR 62 | Temp 97.9°F | Wt 164.4 lb

## 2017-11-23 DIAGNOSIS — I10 Essential (primary) hypertension: Secondary | ICD-10-CM

## 2017-11-23 NOTE — Progress Notes (Signed)
   Subjective:    Patient ID: Angie Fava, male    DOB: 1960-06-14, 57 y.o.   MRN: [id number]  HPI He is here for follow-up visit after recent emergency room visit for his blood pressure.  He was here because he was feeling symptoms he thought were blood pressure related.  The emergency room record was reviewed.  His blood pressure was elevated but certainly not at a point where it would have caused symptoms.   Review of Systems     Objective:   Physical Exam Alert and in no distress.  Blood pressure is recorded. The ER record was reviewed.      Assessment & Plan:  Essential hypertension Claimed that blood pressure is a silent killer causing stroke, heart failure and kidney failure not weakness dizziness feeling bad etc.  Explained that he should probably check his blood pressure no more than once per month and that we are interested in reducing the long-term effect of elevated blood pressure.  Encouraged him to always feel free calling me if he has concerns about whether he needs to go to the emergency room.  I will continue to monitor his blood pressure and have him take it only once per month in the resting, sitting position with arm at heart level.

## 2017-11-26 ENCOUNTER — Ambulatory Visit: Payer: 59 | Admitting: Family Medicine

## 2017-12-10 DIAGNOSIS — K5730 Diverticulosis of large intestine without perforation or abscess without bleeding: Secondary | ICD-10-CM | POA: Diagnosis not present

## 2017-12-10 DIAGNOSIS — Z1211 Encounter for screening for malignant neoplasm of colon: Secondary | ICD-10-CM | POA: Diagnosis not present

## 2017-12-10 LAB — HM COLONOSCOPY

## 2017-12-17 ENCOUNTER — Encounter (INDEPENDENT_AMBULATORY_CARE_PROVIDER_SITE_OTHER): Payer: Self-pay | Admitting: Orthopaedic Surgery

## 2017-12-17 ENCOUNTER — Ambulatory Visit (INDEPENDENT_AMBULATORY_CARE_PROVIDER_SITE_OTHER): Payer: 59 | Admitting: Orthopaedic Surgery

## 2017-12-17 DIAGNOSIS — M25461 Effusion, right knee: Secondary | ICD-10-CM

## 2017-12-17 MED ORDER — METHYLPREDNISOLONE ACETATE 40 MG/ML IJ SUSP
40.0000 mg | INTRAMUSCULAR | Status: AC | PRN
  Administered 2017-12-17: 40 mg via INTRA_ARTICULAR

## 2017-12-17 MED ORDER — LIDOCAINE HCL 1 % IJ SOLN
3.0000 mL | INTRAMUSCULAR | Status: AC | PRN
  Administered 2017-12-17: 3 mL

## 2017-12-17 NOTE — Progress Notes (Signed)
Office Visit Note   Patient: Earl Sanchez           Date of Birth: Apr 30, 1960           MRN: [id number] Visit Date: 12/17/2017              Requested by: Denita Lung, [address], [place] [id number] PCP: Denita Lung, MD   Assessment & Plan: Visit Diagnoses:  1. Effusion, right knee     Plan: I was able to aspirate 45 cc of serous fluid off the knee this gave him any relief.  I did place a steroid in the knee.  He understands the risk and benefits of this type of injection.  He will follow-up as needed however if his effusion recurs he will give [place] a call and come see [place].  If it does recur I would like an AP and lateral of his right knee.  All question concerns were answered and addressed.  Follow-Up Instructions: Return if symptoms worsen or fail to improve.   Orders:  Orders Placed This Encounter  Procedures  . Large Joint Inj   No orders of the defined types were placed in this encounter.     Procedures: Large Joint Inj: R knee on 12/17/2017 2:54 PM Indications: diagnostic evaluation and pain Details: 22 G 1.5 in needle, superolateral approach  Arthrogram: No  Medications: 3 mL lidocaine 1 %; 40 mg methylPREDNISolone acetate 40 MG/ML Outcome: tolerated well, no immediate complications Procedure, treatment alternatives, risks and benefits explained, specific risks discussed. Consent was given by the patient. Immediately prior to procedure a time out was called to verify the correct patient, procedure, equipment, support staff and site/side marked as required. Patient was prepped and draped in the usual sterile fashion.       Clinical Data: No additional findings.   Subjective: Chief Complaint  Patient presents with  . Right Knee - Pain  Patient is a very pleasant 57 year old gentleman who does have a history of ankylosing spondylitis.  He comes in with acute knee swelling and pain of his right knee.  He said about 10 years ago he had  the same thing happened and they did draw fluid off the knee and place a steroid in it.  He is open to have that again today.  There is no current x-rays of his knee but it sounds like we need to just see him first before even consider anything else based on his history.  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  HPI  Review of Systems He currently denies any headache, chest pain, shortness of breath, fever, chills, nausea, vomiting.  He does not report any acute neck issues or back issues.  He denies any numbness and tingling in his upper or lower extremities.  Objective: Vital Signs: There were no vitals taken for this visit.  Physical Exam Is alert and oriented x3 and in no acute distress Ortho Exam He is a thin individual.  His right knee definitely has an effusion is slightly warm.  His range of motion is full and his knee is ligamentously stable. Specialty Comments:  No specialty comments available.  Imaging: No results found.   PMFS History: Patient Active Problem List   Diagnosis Date Noted  . History of colonic polyps 10/10/2016  . History of atrial fibrillation 02/23/2012  . Hypertension 09/12/2010  . ED (erectile dysfunction) 09/12/2010  . Ankylosing spondylitis ([place]) 09/12/2010  .  Hematuria of undiagnosed cause 09/12/2010   Past Medical History:  Diagnosis Date  . Ankylosing spondylitis ([place])   . Erectile dysfunction   . Hypertension   . Microscopic hematuria     Family History  Problem Relation Age of Onset  . Arthritis Mother   . Hypertension Mother   . Colon polyps Brother   . Hypertension Father   . Cancer Father     Past Surgical History:  Procedure Laterality Date  . CARDIOVERSION  03/27/2012   Procedure: CARDIOVERSION;  Surgeon: Josue Hector, MD;  Location: [facility] ENDOSCOPY;  Service: Cardiovascular;  Laterality: N/A;  . LYMPH GLAND EXCISION  1989   left neck/benign   Social History   Occupational History  . Not on file   Tobacco Use  . Smoking status: Never Smoker  . Smokeless tobacco: Never Used  Substance and Sexual Activity  . Alcohol use: Yes    Alcohol/week: 14.0 standard drinks    Types: 14 Cans of beer per week  . Drug use: No  . Sexual activity: Yes    Birth control/protection: Condom

## 2017-12-19 ENCOUNTER — Telehealth: Payer: Self-pay | Admitting: Family Medicine

## 2017-12-19 DIAGNOSIS — I10 Essential (primary) hypertension: Secondary | ICD-10-CM

## 2017-12-19 MED ORDER — LISINOPRIL-HYDROCHLOROTHIAZIDE 10-12.5 MG PO TABS: 1.0000 | ORAL_TABLET | Freq: Every day | ORAL | 3 refills | Status: DC

## 2017-12-19 NOTE — Telephone Encounter (Signed)
Pt needs refill Lisinopril to [facility]

## 2018-01-15 DIAGNOSIS — H2012 Chronic iridocyclitis, left eye: Secondary | ICD-10-CM | POA: Diagnosis not present

## 2018-01-19 ENCOUNTER — Other Ambulatory Visit: Payer: Self-pay | Admitting: Family Medicine

## 2018-01-19 DIAGNOSIS — I10 Essential (primary) hypertension: Secondary | ICD-10-CM

## 2018-02-14 ENCOUNTER — Encounter (INDEPENDENT_AMBULATORY_CARE_PROVIDER_SITE_OTHER): Payer: Self-pay | Admitting: Orthopaedic Surgery

## 2018-02-14 ENCOUNTER — Ambulatory Visit (INDEPENDENT_AMBULATORY_CARE_PROVIDER_SITE_OTHER): Payer: Self-pay

## 2018-02-14 ENCOUNTER — Ambulatory Visit (INDEPENDENT_AMBULATORY_CARE_PROVIDER_SITE_OTHER): Payer: 59 | Admitting: Orthopaedic Surgery

## 2018-02-14 DIAGNOSIS — M25561 Pain in right knee: Secondary | ICD-10-CM

## 2018-02-14 DIAGNOSIS — G8929 Other chronic pain: Secondary | ICD-10-CM | POA: Diagnosis not present

## 2018-02-14 DIAGNOSIS — M25461 Effusion, right knee: Secondary | ICD-10-CM | POA: Diagnosis not present

## 2018-02-14 MED ORDER — METHYLPREDNISOLONE ACETATE 40 MG/ML IJ SUSP
40.0000 mg | INTRAMUSCULAR | Status: AC | PRN
  Administered 2018-02-14: 40 mg via INTRA_ARTICULAR

## 2018-02-14 MED ORDER — LIDOCAINE HCL 1 % IJ SOLN
3.0000 mL | INTRAMUSCULAR | Status: AC | PRN
  Administered 2018-02-14: 3 mL

## 2018-02-14 NOTE — Progress Notes (Signed)
Office Visit Note   Patient: Earl Sanchez           Date of Birth: 05/19/1960           MRN: [id number] Visit Date: 02/14/2018              Requested by: Denita Lung, MD [place], [place] [id number] PCP: Denita Lung, MD   Assessment & Plan: Visit Diagnoses:  1. Chronic pain of right knee   2. Effusion, right knee     Plan: I was able to aspirate 30 cc of fluid from his knee that was clear.  It does appear to be consistent with an arthropathy.  I placed a steroid injection in his knee as well.  This is a second recurrent effusion he has had.  He likely has a component of synovitis from his ankylosing spondylitis.  I do feel an MRI is warranted of his right knee to assess for internal derangement if he has a third effusion and he does understand this as well.  If it worsens anyway he will give [place] a call in antibiotics step would be to obtain an MRI of his knee.  Follow-Up Instructions: Return if symptoms worsen or fail to improve.   Orders:  Orders Placed This Encounter  Procedures  . XR Knee 1-2 Views Right   No orders of the defined types were placed in this encounter.     Procedures: Large Joint Inj on 02/14/2018 1:30 PM Indications: diagnostic evaluation and pain Details: 22 G 1.5 in needle, superolateral approach  Arthrogram: No  Medications: 3 mL lidocaine 1 %; 40 mg methylPREDNISolone acetate 40 MG/ML Outcome: tolerated well, no immediate complications Procedure, treatment alternatives, risks and benefits explained, specific risks discussed. Consent was given by the patient. Immediately prior to procedure a time out was called to verify the correct patient, procedure, equipment, support staff and site/side marked as required. Patient was prepped and draped in the usual sterile fashion.       Clinical Data: No additional findings.   Subjective: Chief Complaint  Patient presents with  . Right Knee - Pain  Patient is well-known to  [place].  He is a very pleasant 57 year old gentleman with a history of ankylosing spondylitis.  At one point we had aspirated his knee and provide a steroid injection due to an effusion.  He had done well for a while and is developed an effusion again.  He is not sure if it is from injuring the knee on the job.  Is not very painful but the effusion is become significant in his right knee.  He denies any redness of the knee and denies any locking catching.  HPI  Review of Systems He currently denies any headache, chest pain, shortness of breath, fever, chills, nausea, vomiting.  Objective: Vital Signs: There were no vitals taken for this visit.  Physical Exam He is alert and orient x3 and in no acute distress Ortho Exam Examination of the right knee shows a slightly warm but no redness.  His range of motion is full and the knee feels loosely stable.  There is a moderate effusion. Specialty Comments:  No specialty comments available.  Imaging: Xr Knee 1-2 Views Right  Result Date: 02/14/2018 2 views of the right knee show well-maintained medial lateral compartments.  There is patellofemoral arthritic changes.  The overall alignment is near neutral.  There is an effusion.    PMFS History: Patient Active Problem List  Diagnosis Date Noted  . Effusion, right knee 02/14/2018  . Chronic pain of right knee 02/14/2018  . History of colonic polyps 10/10/2016  . History of atrial fibrillation 02/23/2012  . Hypertension 09/12/2010  . ED (erectile dysfunction) 09/12/2010  . Ankylosing spondylitis ([place]) 09/12/2010  . Hematuria of undiagnosed cause 09/12/2010   Past Medical History:  Diagnosis Date  . Ankylosing spondylitis ([place])   . Erectile dysfunction   . Hypertension   . Microscopic hematuria     Family History  Problem Relation Age of Onset  . Arthritis Mother   . Hypertension Mother   . Colon polyps Brother   . Hypertension Father   . Cancer Father     Past Surgical History:    Procedure Laterality Date  . CARDIOVERSION  03/27/2012   Procedure: CARDIOVERSION;  Surgeon: Josue Hector, MD;  Location: [facility] ENDOSCOPY;  Service: Cardiovascular;  Laterality: N/A;  . LYMPH GLAND EXCISION  1989   left neck/benign   Social History   Occupational History  . Not on file  Tobacco Use  . Smoking status: Never Smoker  . Smokeless tobacco: Never Used  Substance and Sexual Activity  . Alcohol use: Yes    Alcohol/week: 14.0 standard drinks    Types: 14 Cans of beer per week  . Drug use: No  . Sexual activity: Yes    Birth control/protection: Condom

## 2018-04-23 ENCOUNTER — Other Ambulatory Visit: Payer: Self-pay | Admitting: Family Medicine

## 2018-04-23 DIAGNOSIS — I10 Essential (primary) hypertension: Secondary | ICD-10-CM

## 2018-04-25 ENCOUNTER — Other Ambulatory Visit: Payer: Self-pay | Admitting: Family Medicine

## 2018-04-25 DIAGNOSIS — I10 Essential (primary) hypertension: Secondary | ICD-10-CM

## 2018-10-22 ENCOUNTER — Other Ambulatory Visit: Payer: Self-pay | Admitting: Family Medicine

## 2018-10-22 DIAGNOSIS — I10 Essential (primary) hypertension: Secondary | ICD-10-CM

## 2018-10-28 ENCOUNTER — Encounter: Payer: 59 | Admitting: Family Medicine

## 2018-11-19 ENCOUNTER — Encounter: Payer: Self-pay | Admitting: Family Medicine

## 2019-01-17 ENCOUNTER — Other Ambulatory Visit: Payer: Self-pay | Admitting: Medical

## 2019-01-17 ENCOUNTER — Other Ambulatory Visit: Payer: Self-pay | Admitting: Family Medicine

## 2019-01-17 DIAGNOSIS — I10 Essential (primary) hypertension: Secondary | ICD-10-CM

## 2019-01-22 ENCOUNTER — Encounter: Payer: 59 | Admitting: Family Medicine

## 2019-02-23 ENCOUNTER — Other Ambulatory Visit: Payer: Self-pay | Admitting: Medical

## 2019-02-23 DIAGNOSIS — I10 Essential (primary) hypertension: Secondary | ICD-10-CM

## 2019-02-24 NOTE — Telephone Encounter (Signed)
Patient has canceled several appointments.

## 2019-02-25 ENCOUNTER — Other Ambulatory Visit: Payer: Self-pay | Admitting: Family Medicine

## 2019-02-25 DIAGNOSIS — I10 Essential (primary) hypertension: Secondary | ICD-10-CM

## 2019-02-25 NOTE — Telephone Encounter (Signed)
He needs an appointment

## 2019-02-25 NOTE — Telephone Encounter (Signed)
LVM for pt to call and make an appointment for med check or a CPE.

## 2019-03-26 ENCOUNTER — Other Ambulatory Visit: Payer: Self-pay | Admitting: Family Medicine

## 2019-03-26 DIAGNOSIS — I10 Essential (primary) hypertension: Secondary | ICD-10-CM

## 2019-04-01 ENCOUNTER — Other Ambulatory Visit: Payer: Self-pay | Admitting: Family Medicine

## 2019-04-01 DIAGNOSIS — I10 Essential (primary) hypertension: Secondary | ICD-10-CM

## 2019-04-07 ENCOUNTER — Encounter (HOSPITAL_COMMUNITY): Payer: Self-pay

## 2019-04-07 ENCOUNTER — Other Ambulatory Visit: Payer: Self-pay

## 2019-04-07 ENCOUNTER — Ambulatory Visit (HOSPITAL_COMMUNITY)
Admission: EM | Admit: 2019-04-07 | Discharge: 2019-04-07 | Disposition: A | Discharge status: Home / Self Care | Payer: BLUE CROSS/BLUE SHIELD | Attending: Family Medicine | Admitting: Family Medicine

## 2019-04-07 DIAGNOSIS — Z760 Encounter for issue of repeat prescription: Secondary | ICD-10-CM

## 2019-04-07 DIAGNOSIS — I10 Essential (primary) hypertension: Secondary | ICD-10-CM | POA: Diagnosis not present

## 2019-04-07 MED ORDER — LISINOPRIL 10 MG PO TABS: 10.0000 mg | ORAL_TABLET | Freq: Every day | ORAL | 0 refills | Status: DC

## 2019-04-07 NOTE — ED Triage Notes (Signed)
Patient presents to Urgent Care with complaints of needing a refill on his lisinopril since running out this morning. Patient reports he had an insurance change and was told he could keep his PCP but in the end he could not. Pt just needs a refill to get him through until he can find a new doctor.

## 2019-04-07 NOTE — Discharge Instructions (Signed)
I would recommend rechecking your blood pressure this afternoon when you are relaxed.  I have refilled your medication for 30 days, as we do not regularly manage long term medications here from urgent care.  Please call today to make appointment to establish care as first appointments sometimes can take a few weeks to get in.  If your blood pressure is consistently elevated, >170/100, or you develop headache, chest pain , dizziness, vision changes, or otherwise concerned please return

## 2019-04-07 NOTE — ED Provider Notes (Signed)
[facility]    CSN: [id number] Arrival date & time: 04/07/19  0807      History   Chief Complaint Chief Complaint  Patient presents with  . Medication Refill    HPI Earl Sanchez is a 59 y.o. male.   Earl Sanchez presents with requests for blood pressure medication refill. He took his last lisinopril this morning, approximately 1.5 hour prior to arrival. His insurance changed and therefore hasn't yet established with a new PCP. Saw his PCP last approximately a year ago he states, last documented visit in [place] in 2019. At one point he was given lisinopril hctz but states that this was discontinued and he has since been on simply lisinopril. No complaints today, no chest pain , no palpitations, no shortness of breath , no headache or vision changes. He does check his BP with a wrist cuff at home daily and states that it tends to run in the 140's/80s, this morning 149/87.     ROS per HPI, negative if not otherwise mentioned.      Past Medical History:  Diagnosis Date  . Ankylosing spondylitis ([place])   . Erectile dysfunction   . Hypertension   . Microscopic hematuria     Patient Active Problem List   Diagnosis Date Noted  . Effusion, right knee 02/14/2018  . Chronic pain of right knee 02/14/2018  . History of colonic polyps 10/10/2016  . History of atrial fibrillation 02/23/2012  . Hypertension 09/12/2010  . ED (erectile dysfunction) 09/12/2010  . Ankylosing spondylitis ([place]) 09/12/2010  . Hematuria of undiagnosed cause 09/12/2010    Past Surgical History:  Procedure Laterality Date  . CARDIOVERSION  03/27/2012   Procedure: CARDIOVERSION;  Surgeon: Josue Hector, MD;  Location: [facility] ENDOSCOPY;  Service: Cardiovascular;  Laterality: N/A;  . [place]   left neck/benign       Home Medications    Prior to Admission medications   Medication Sig Start Date End Date Taking? Authorizing Provider  lisinopril (ZESTRIL) 10 MG tablet Take 1  tablet (10 mg total) by mouth daily. 04/07/19   Zigmund Gottron, NP  lisinopril-hydrochlorothiazide (PRINZIDE,ZESTORETIC) 10-12.5 MG tablet Take 1 tablet by mouth daily. 12/19/17   Denita Lung, MD    Family History Family History  Problem Relation Age of Onset  . Arthritis Mother   . Hypertension Mother   . Colon polyps Brother   . Hypertension Father   . Cancer Father     Social History Social History   Tobacco Use  . Smoking status: Never Smoker  . Smokeless tobacco: Never Used  Substance Use Topics  . Alcohol use: Yes    Alcohol/week: 14.0 standard drinks    Types: 14 Cans of beer per week  . Drug use: No     Allergies   Patient has no known allergies.   Review of Systems Review of Systems   Physical Exam Triage Vital Signs ED Triage Vitals  Enc Vitals Group     BP 04/07/19 0830 (!) 222/127     Pulse Rate 04/07/19 0830 79     Resp 04/07/19 0830 18     Temp 04/07/19 0830 98.4 F (36.9 C)     Temp src --      SpO2 04/07/19 0830 98 %     Weight --      Height --      Head Circumference --      Peak Flow --  Pain Score 04/07/19 0829 0     Pain Loc --      Pain Edu? --      Excl. in [place]? --    No data found.  Updated Vital Signs BP (!) 185/104 (BP Location: Right Arm)   Pulse 79   Temp 98.4 F (36.9 C)   Resp 18   SpO2 98%   Physical Exam Constitutional:      Appearance: He is well-developed.  Cardiovascular:     Rate and Rhythm: Normal rate and regular rhythm.  Pulmonary:     Effort: Pulmonary effort is normal.  Skin:    General: Skin is warm and dry.  Neurological:     Mental Status: He is alert and oriented to person, place, and time.      UC Treatments / Results  Labs (all labs ordered are listed, but only abnormal results are displayed) Labs Reviewed - No data to display  EKG   Radiology No results found.  Procedures Procedures (including critical care time)  Medications Ordered in UC Medications - No data to  display  Initial Impression / Assessment and Plan / UC Course  I have reviewed the triage vital signs and the nursing notes.  Pertinent labs & imaging results that were available during my care of the patient were reviewed by me and considered in my medical decision making (see chart for details).     Noted hypertension here in clinic today, took Rx approximately 1.5 hours prior to arrival. Patient states that he got somewhat "worked up" while in the waiting room, and feels that has contributed to his elevation here as well. No headache or other complaints today. 30 day supply of lisinopril provided, discussed that this is not managed long term out of urgent care and emphasized need for follow up/ establish with PCP. Patient verbalized understanding and agreeable to plan.   Final Clinical Impressions(s) / UC Diagnoses   Final diagnoses:  Medication refill  Hypertension, unspecified type     Discharge Instructions     I would recommend rechecking your blood pressure this afternoon when you are relaxed.  I have refilled your medication for 30 days, as we do not regularly manage long term medications here from urgent care.  Please call today to make appointment to establish care as first appointments sometimes can take a few weeks to get in.  If your blood pressure is consistently elevated, >170/100, or you develop headache, chest pain , dizziness, vision changes, or otherwise concerned please return   ED Prescriptions    Medication Sig Dispense Auth. Provider   lisinopril (ZESTRIL) 10 MG tablet Take 1 tablet (10 mg total) by mouth daily. 30 tablet Zigmund Gottron, NP     PDMP not reviewed this encounter.   Zigmund Gottron, NP 04/07/19 [id number]

## 2020-05-02 IMAGING — CT CT HEAD W/O CM
4 series · 16 of 47 positions shown, 18 images · non-contrast
Comparison: None.

CLINICAL DATA: Dizziness and headaches

EXAM:
CT HEAD WITHOUT CONTRAST
TECHNIQUE: Contiguous axial images were obtained from the base of the skull
through the vertex without intravenous contrast.

[Series 3: head wo · axial · 0.46mm/px · z∈[-142,-16]mm · 7 of 35 slices shown, 9 images]
[im 5/35  brain]
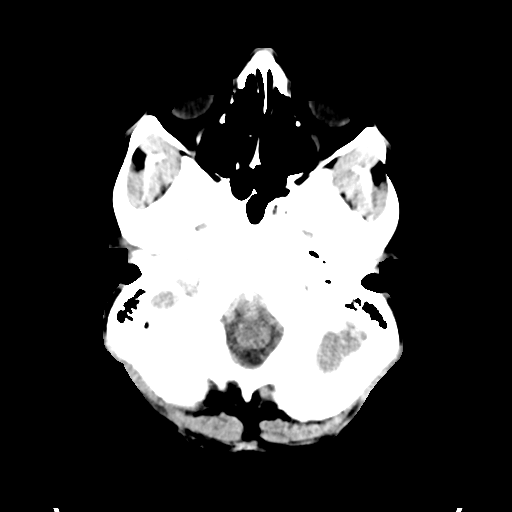
[im 5/35  bone]
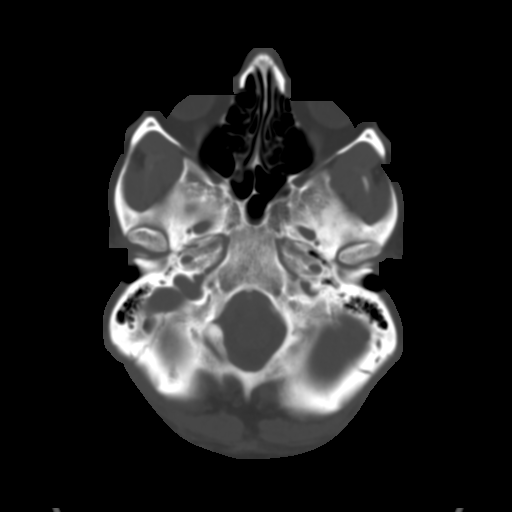
[im 9/35  brain]
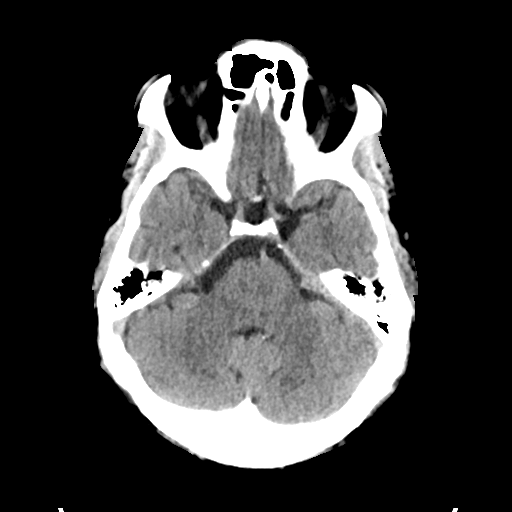
[im 13/35  brain]
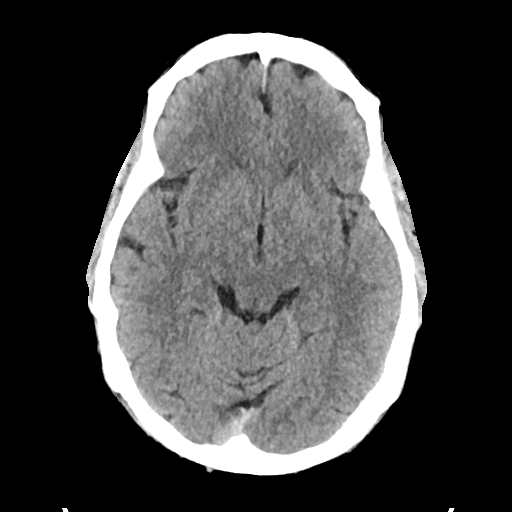
[im 18/35  brain]
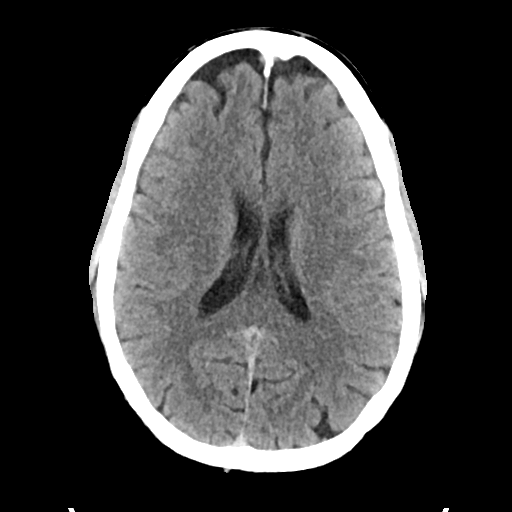
[im 22/35  brain]
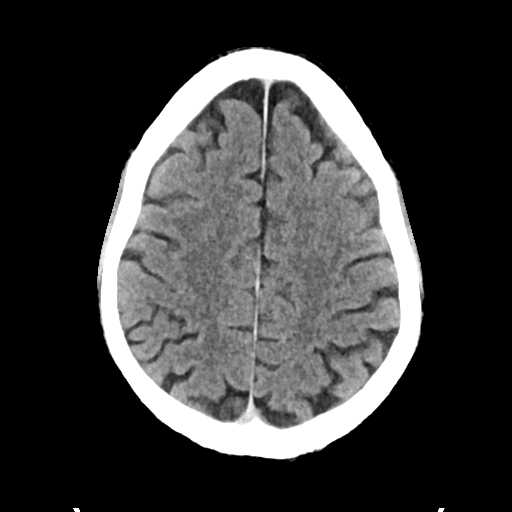
[im 22/35  bone]
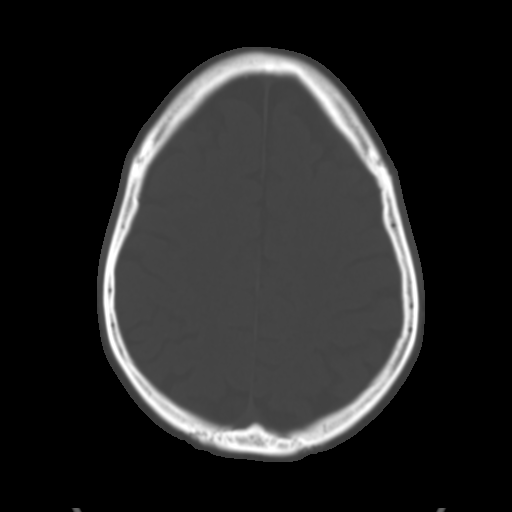
[im 26/35  brain]
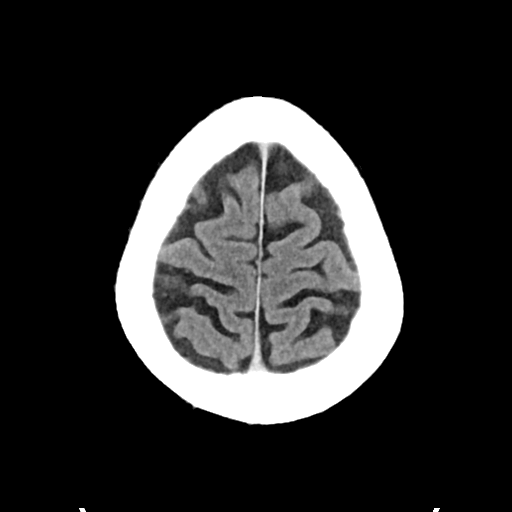
[im 30/35  brain]
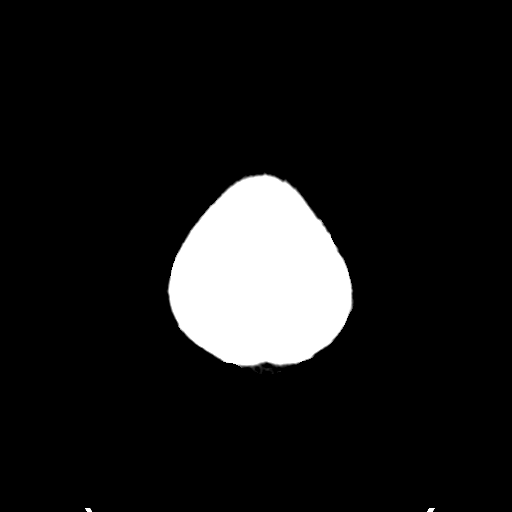

[Series 4: head bone · axial · 0.46mm/px · z∈[-146,-112]mm · 3 of 86 slices shown]
[im 9/86  bone]
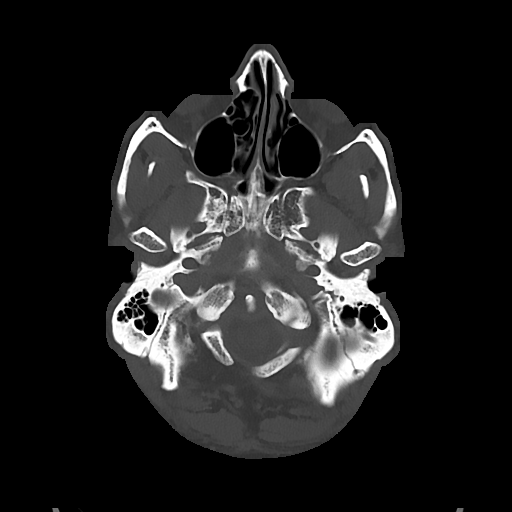
[im 18/86  bone]
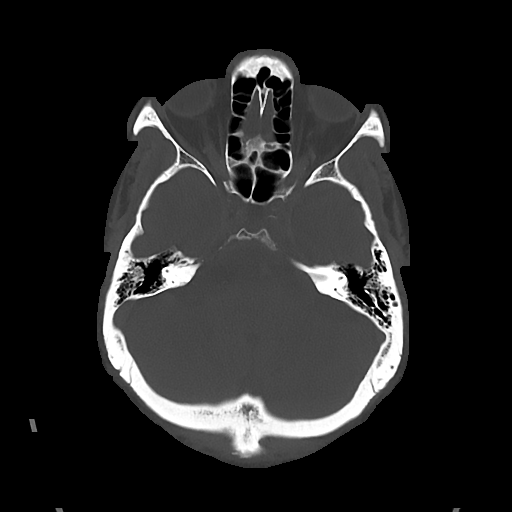
[im 26/86  bone]
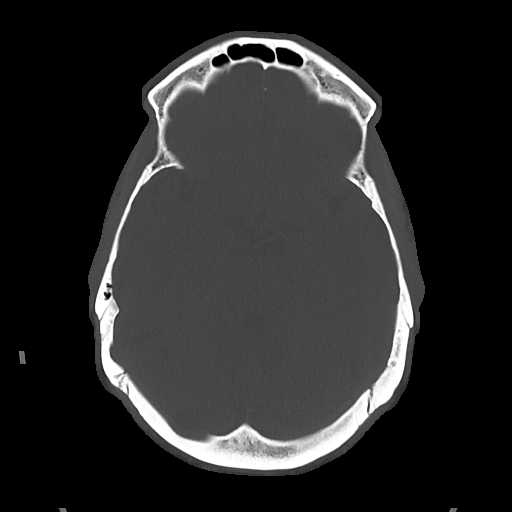

[Series 5: cor soft · coronal · 0.33mm/px · 3 of 78 slices shown]
[im 26/78  brain]
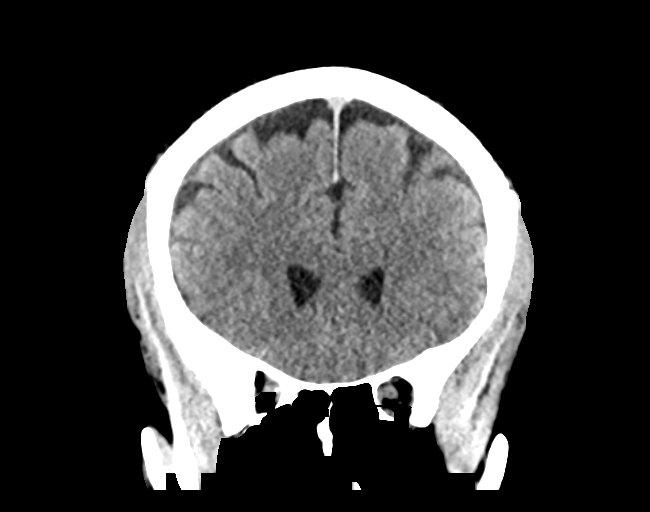
[im 35/78  brain]
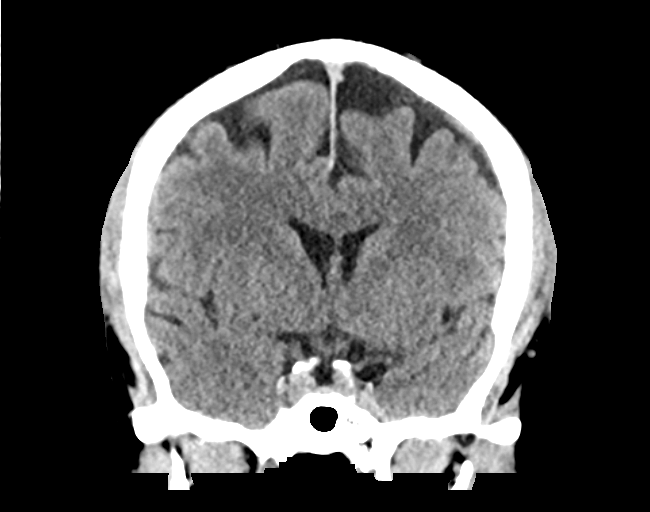
[im 43/78  brain]
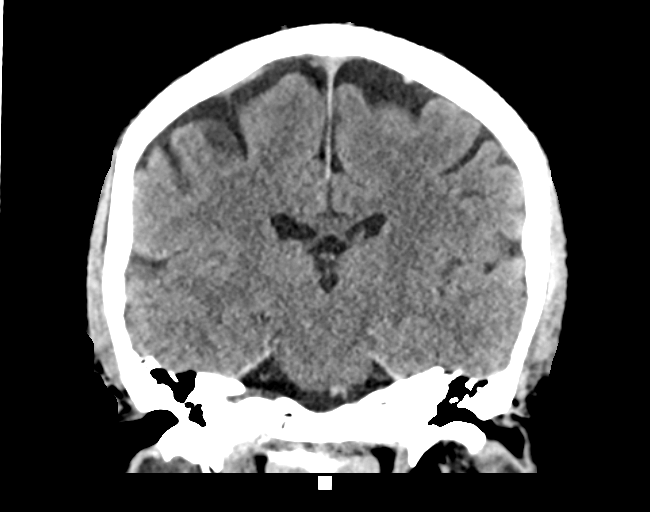

[Series 6: sag soft · sagittal · 0.33mm/px · 3 of 67 slices shown]
[im 23/67  brain]
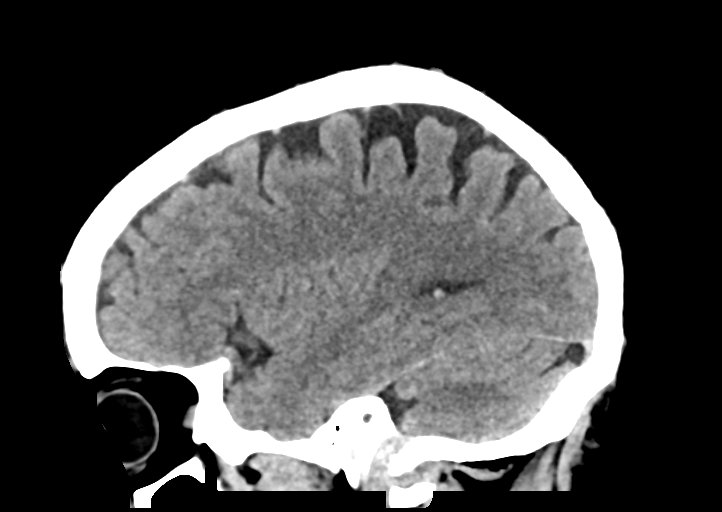
[im 34/67  brain]
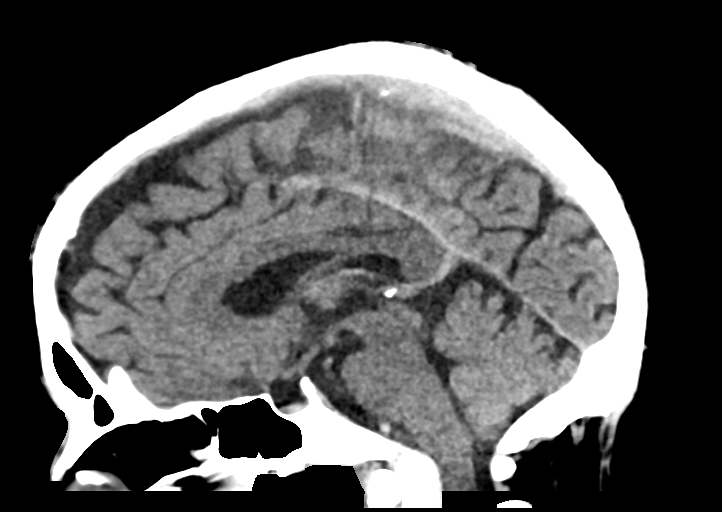
[im 45/67  brain]
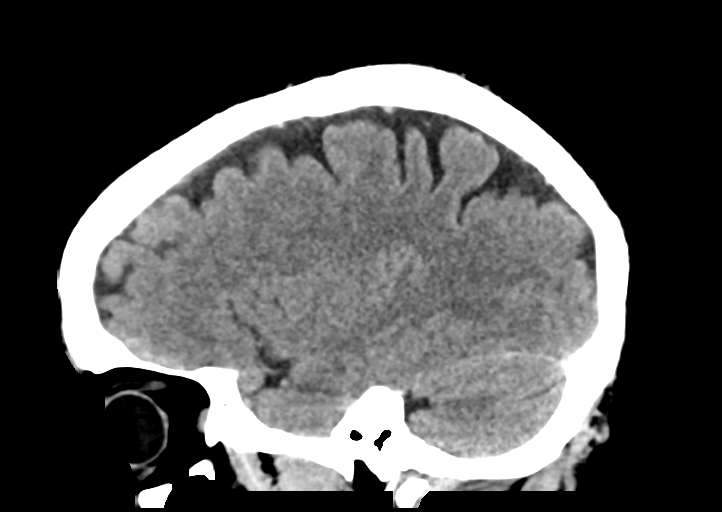

[16 of 47 positions shown; findings below may reference images not displayed]

FINDINGS: Brain: No evidence of acute infarction, hemorrhage, hydrocephalus,
extra-axial collection or mass lesion/mass effect.

Vascular: No hyperdense vessel or unexpected calcification.

Skull: No osseous abnormality.

Sinuses/Orbits: Visualized paranasal sinuses are clear. Visualized
mastoid sinuses are clear. Visualized orbits demonstrate no focal
abnormality.

Other: None
IMPRESSION: No acute intracranial pathology.

## 2020-11-09 ENCOUNTER — Encounter (HOSPITAL_COMMUNITY): Payer: Self-pay

## 2020-11-09 ENCOUNTER — Other Ambulatory Visit: Payer: Self-pay

## 2020-11-09 ENCOUNTER — Emergency Department (HOSPITAL_COMMUNITY): Payer: PRIVATE HEALTH INSURANCE

## 2020-11-09 ENCOUNTER — Emergency Department (HOSPITAL_COMMUNITY)
Admission: EM | Admit: 2020-11-09 | Discharge: 2020-11-10 | Disposition: A | Discharge status: Home / Self Care | Payer: PRIVATE HEALTH INSURANCE | Attending: Emergency Medicine | Admitting: Emergency Medicine

## 2020-11-09 DIAGNOSIS — I10 Essential (primary) hypertension: Secondary | ICD-10-CM | POA: Insufficient documentation

## 2020-11-09 DIAGNOSIS — S6992XA Unspecified injury of left wrist, hand and finger(s), initial encounter: Secondary | ICD-10-CM | POA: Diagnosis present

## 2020-11-09 DIAGNOSIS — S62631A Displaced fracture of distal phalanx of left index finger, initial encounter for closed fracture: Secondary | ICD-10-CM | POA: Diagnosis not present

## 2020-11-09 DIAGNOSIS — Z79899 Other long term (current) drug therapy: Secondary | ICD-10-CM | POA: Insufficient documentation

## 2020-11-09 DIAGNOSIS — W292XXA Contact with other powered household machinery, initial encounter: Secondary | ICD-10-CM | POA: Insufficient documentation

## 2020-11-09 DIAGNOSIS — Y990 Civilian activity done for income or pay: Secondary | ICD-10-CM | POA: Diagnosis not present

## 2020-11-09 DIAGNOSIS — S62631B Displaced fracture of distal phalanx of left index finger, initial encounter for open fracture: Secondary | ICD-10-CM

## 2020-11-09 DIAGNOSIS — Z23 Encounter for immunization: Secondary | ICD-10-CM | POA: Insufficient documentation

## 2020-11-09 MED ORDER — OXYCODONE-ACETAMINOPHEN 5-325 MG PO TABS
1.0000 | ORAL_TABLET | Freq: Once | ORAL | Status: AC
  Administered 2020-11-09: 1 via ORAL
  Filled 2020-11-09: qty 1

## 2020-11-09 NOTE — ED Provider Notes (Signed)
Emergency Medicine Provider Triage Evaluation Note  Earl Sanchez , a 60 y.o. male  was evaluated in triage.  Pt complains of laceration to left index finger.  Laceration occurred approximately 1430 today.  Patient reports that he cut himself while at work on a compression fan.  Reports pain to affected finger.Patient denies any numbness or weakness.  Patient is unsure when his last tetanus shot was.  Patient is right-hand dominant.  Review of Systems  Positive: Myalgia, wound Negative: Numbness, weakness, color change  Physical Exam  BP (!) 192/108 (BP Location: Left Arm)   Pulse 62   Temp 98.7 F (37.1 C) (Oral)   Resp 14   SpO2 99%  Gen:   Awake, no distress   Resp:  Normal effort  MSK:   Moves extremities without difficulty, patient has laceration to left second finger with nail involvement.  Decreased sensation to distal tip of affected finger.  Full range of motion to affected finger. Other:  +2 left radial pulse.  Medical Decision Making  Medically screening exam initiated at 4:43 PM.  Appropriate orders placed.  Grover was informed that the remainder of the evaluation will be completed by another provider, this initial triage assessment does not replace that evaluation, and the importance of remaining in the ED until their evaluation is complete.     Loni Beckwith, PA-C 11/09/20 1645    Margette Fast, MD 11/09/20 1731

## 2020-11-09 NOTE — ED Triage Notes (Signed)
Pt presents with a lac to Left pointer finger from an extraction fan. Incident occurred at work at approx 2:30pm  Bleeding controlled in triage

## 2020-11-10 ENCOUNTER — Encounter (HOSPITAL_COMMUNITY): Payer: Self-pay | Admitting: Emergency Medicine

## 2020-11-10 MED ORDER — TETANUS-DIPHTH-ACELL PERTUSSIS 5-2.5-18.5 LF-MCG/0.5 IM SUSY
0.5000 mL | PREFILLED_SYRINGE | Freq: Once | INTRAMUSCULAR | Status: AC
  Administered 2020-11-10: 0.5 mL via INTRAMUSCULAR
  Filled 2020-11-10: qty 0.5

## 2020-11-10 MED ORDER — DOXYCYCLINE HYCLATE 100 MG PO CAPS: 100.0000 mg | ORAL_CAPSULE | Freq: Two times a day (BID) | ORAL | 0 refills | Status: DC

## 2020-11-10 MED ORDER — CEPHALEXIN 500 MG PO CAPS: 500.0000 mg | ORAL_CAPSULE | Freq: Four times a day (QID) | ORAL | 0 refills | Status: DC

## 2020-11-10 MED ORDER — TRAMADOL HCL 50 MG PO TABS: 50.0000 mg | ORAL_TABLET | Freq: Four times a day (QID) | ORAL | 0 refills | Status: DC | PRN

## 2020-11-10 MED ORDER — CEFTRIAXONE SODIUM 1 G IJ SOLR
1.0000 g | Freq: Once | INTRAMUSCULAR | Status: AC
  Administered 2020-11-10: 1 g via INTRAMUSCULAR
  Filled 2020-11-10: qty 10

## 2020-11-10 MED ORDER — LIDOCAINE HCL (PF) 1 % IJ SOLN
INTRAMUSCULAR | Status: AC
  Administered 2020-11-10: 2.1 mL
  Filled 2020-11-10: qty 5

## 2020-11-10 NOTE — Discharge Instructions (Addendum)
Do not submerge finger in any body of water: tub, lake, pool, ocean, stream.  Take all antibioitcs and follow up with hand surgery

## 2020-11-10 NOTE — ED Provider Notes (Signed)
[facility] EMERGENCY DEPARTMENT Provider Note   CSN: [id number] Arrival date & time: 11/09/20  1551     History No chief complaint on file.   Earl Sanchez is a 60 y.o. male.  The history is provided by the patient.  Laceration Location:  Finger Finger laceration location:  L index finger Length:  1 cm Depth:  Through underlying tissue Quality: straight   Bleeding: controlled   Time since incident:  16 hours Injury mechanism: a compression fan at work. Pain details:    Quality:  Aching   Severity:  Severe   Timing:  Constant   Progression:  Unchanged Foreign body present:  No foreign bodies Relieved by:  Nothing Worsened by:  Nothing Ineffective treatments:  None tried Tetanus status:  Out of date Associated symptoms: no fever, no focal weakness and no streaking       Past Medical History:  Diagnosis Date   Ankylosing spondylitis ([place])    Erectile dysfunction    Hypertension    Microscopic hematuria     Patient Active Problem List   Diagnosis Date Noted   Effusion, right knee 02/14/2018   Chronic pain of right knee 02/14/2018   History of colonic polyps 10/10/2016   History of atrial fibrillation 02/23/2012   Hypertension 09/12/2010   ED (erectile dysfunction) 09/12/2010   Ankylosing spondylitis ([place]) 09/12/2010   Hematuria of undiagnosed cause 09/12/2010    Past Surgical History:  Procedure Laterality Date   CARDIOVERSION  03/27/2012   Procedure: CARDIOVERSION;  Surgeon: Josue Hector, MD;  Location: [facility] ENDOSCOPY;  Service: Cardiovascular;  Laterality: N/A;   [place]   left neck/benign       Family History  Problem Relation Age of Onset   Arthritis Mother    Hypertension Mother    Colon polyps Brother    Hypertension Father    Cancer Father     Social History   Tobacco Use   Smoking status: Never   Smokeless tobacco: Never  Vaping Use   Vaping Use: Never used  Substance Use Topics   Alcohol  use: Yes    Alcohol/week: 14.0 standard drinks    Types: 14 Cans of beer per week   Drug use: No    Home Medications Prior to Admission medications   Medication Sig Start Date End Date Taking? Authorizing Provider  lisinopril (ZESTRIL) 20 MG tablet Take 20 mg by mouth daily. 05/31/20  Yes [provider]  Multiple Vitamins-Minerals (ONE-A-DAY MENS 50+) TABS Take 1 tablet by mouth daily.   Yes [provider]  lisinopril (ZESTRIL) 10 MG tablet Take 1 tablet (10 mg total) by mouth daily. Patient not taking: No sig reported 04/07/19   Augusto Gamble B, NP  lisinopril-hydrochlorothiazide (PRINZIDE,ZESTORETIC) 10-12.5 MG tablet Take 1 tablet by mouth daily. Patient not taking: No sig reported 12/19/17   Denita Lung, MD    Allergies    Patient has no known allergies.  Review of Systems   Review of Systems  Constitutional:  Negative for fever.  HENT:  Negative for drooling.   Eyes:  Negative for redness.  Respiratory:  Negative for wheezing.   Cardiovascular:  Negative for leg swelling.  Gastrointestinal:  Negative for vomiting.  Musculoskeletal:  Positive for arthralgias. Negative for neck stiffness.  Skin:  Positive for wound.  Neurological:  Negative for focal weakness and facial asymmetry.  Psychiatric/Behavioral:  Negative for agitation.   All other systems reviewed and are negative.  Physical Exam Updated Vital Signs BP (!) 164/114 (BP Location: Right Arm)   Pulse (!) 53   Temp 97.9 F (36.6 C) (Oral)   Resp 17   SpO2 100%   Physical Exam Vitals and nursing note reviewed. Exam conducted with a chaperone present.  Constitutional:      General: He is not in acute distress.    Appearance: Normal appearance.  HENT:     Head: Normocephalic and atraumatic.     Nose: Nose normal.  Eyes:     Extraocular Movements: Extraocular movements intact.     Conjunctiva/sclera: Conjunctivae normal.  Cardiovascular:     Rate and Rhythm: Normal rate and regular  rhythm.     Pulses: Normal pulses.     Heart sounds: Normal heart sounds.  Pulmonary:     Effort: Pulmonary effort is normal.     Breath sounds: Normal breath sounds.  Abdominal:     General: Abdomen is flat. Bowel sounds are normal.     Palpations: Abdomen is soft.  Musculoskeletal:     Left hand: Tenderness and bony tenderness present. There is no disruption of two-point discrimination. Normal pulse.       Arms:     Cervical back: Normal range of motion and neck supple.  Skin:    General: Skin is warm and dry.  Neurological:     General: No focal deficit present.     Mental Status: He is alert.     Deep Tendon Reflexes: Reflexes normal.  Psychiatric:        Mood and Affect: Mood normal.        Behavior: Behavior normal.       ED Results / Procedures / Treatments   Labs (all labs ordered are listed, but only abnormal results are displayed) Labs Reviewed - No data to display  EKG None  Radiology DG Finger Index Left  Result Date: 11/09/2020 CLINICAL DATA:  Laceration EXAM: LEFT INDEX FINGER 2+V COMPARISON:  None. FINDINGS: Acute mildly comminuted fracture involving tuft of the distal phalanx with distraction. No subluxation or radiopaque foreign body IMPRESSION: Acute comminuted and slightly distracted fracture involving the tuft of the distal phalanx Electronically Signed   By: Donavan Foil M.D.   On: 11/09/2020 17:59    Procedures Procedures   Medications Ordered in ED Medications  oxyCODONE-acetaminophen (PERCOCET/ROXICET) 5-325 MG per tablet 1 tablet (1 tablet Oral Given 11/09/20 1728)  Tdap (BOOSTRIX) injection 0.5 mL (0.5 mLs Intramuscular Given 11/10/20 0502)    ED Course  I have reviewed the triage vital signs and the nursing notes.  Pertinent labs & imaging results that were available during my care of the patient were reviewed by me and considered in my medical decision making (see chart for details).    MDM Rules/Calculators/A&P                            516 Case d/w Dr. Lenon Curt.  Non-adherent dressing, and follow up in the office.  Please call today.     Tetanus updated in the ED  Extensive wound care in the ED.  Macerated tissue, more than 12 hours old, nothing to suture at this time.  Cleansed in iodin and sterile saline.  Bacitracin and then xerform and large sterile bulky dressing placed   Antibiotics and narcotic pain medication sent to pharmacy.  No driving on narcotic pain medication, call today to be seen by Dr. Lenon Curt.  Take all antibiotics  as directed and do not submerge finger.  This was conveyed to patient verbally and in writing on DC paperwork.  Patient verbalizes understanding of all oral and written instruction and agrees to follow up.    Kula was evaluated in Emergency Department on 11/10/2020 for the symptoms described in the history of present illness. He was evaluated in the context of the global COVID-19 pandemic, which necessitated consideration that the patient might be at risk for infection with the SARS-CoV-2 virus that causes COVID-19. Institutional protocols and algorithms that pertain to the evaluation of patients at risk for COVID-19 are in a state of rapid change based on information released by regulatory bodies including the CDC and federal and state organizations. These policies and algorithms were followed during the patient's care in the ED.  Final Clinical Impression(s) / ED Diagnoses Final diagnoses:  None     Return for intractable cough, coughing up blood, fevers > 100.4 unrelieved by medication, shortness of breath, intractable vomiting, chest pain, shortness of breath, weakness, numbness, changes in speech, facial asymmetry, abdominal pain, passing out, Inability to tolerate liquids or food, cough, altered mental status or any concerns. No signs of systemic illness or infection. The patient is nontoxic-appearing on exam and vital signs are within normal limits. I have reviewed the triage vital  signs and the nursing notes. Pertinent labs & imaging results that were available during my care of the patient were reviewed by me and considered in my medical decision making (see chart for details). After history, exam, and medical workup I feel the patient has been appropriately medically screened and is safe for discharge home. Pertinent diagnoses were discussed with the patient. Patient was given return precautions.    Rx / DC Orders ED Discharge Orders     None        Cole Klugh, MD 11/10/20 [id number]

## 2023-04-19 ENCOUNTER — Encounter: Payer: Self-pay | Admitting: Family Medicine

## 2023-04-19 ENCOUNTER — Ambulatory Visit: Payer: Medicaid Other | Admitting: Family Medicine

## 2023-04-19 VITALS — BP 134/82 | HR 68 | Wt 159.0 lb

## 2023-04-19 DIAGNOSIS — I10 Essential (primary) hypertension: Secondary | ICD-10-CM | POA: Diagnosis not present

## 2023-04-19 MED ORDER — LISINOPRIL 10 MG PO TABS: 10.0000 mg | ORAL_TABLET | Freq: Every day | ORAL | 3 refills | Status: DC

## 2023-04-19 NOTE — Progress Notes (Signed)
   Subjective:    Patient ID: Vonda Antigua, male    DOB: September 25, 1960, 63 y.o.   MRN: [id number]  HPI He is here after several year absence due to insurance issues.  He is happy to be back.  He is now retired and enjoying his retirement.  Does have underlying hypertension and has been reducing his lisinopril dosing mainly because he is now much more physically active and eating much better.  His medical record was reviewed.  He did have a complete exam done in May.  He also had a great deal difficulty dealing with a finger fracture and still does have a slight deformity but apparently has not causing a lot of difficulty.   Review of Systems     Objective:    Physical Exam Alert and in no distress. Tympanic membranes and canals are normal. Pharyngeal area is normal. Neck is supple without adenopathy or thyromegaly. Cardiac exam shows a regular sinus rhythm without murmurs or gallops. Lungs are clear to auscultation.        Assessment & Plan:  Primary hypertension - Plan: lisinopril (ZESTRIL) 10 MG tablet I discussed proper dosing of the Zestril and he would like to go down to the 10 mg to see if how well he is doing on that since he is doing a much better job of taking care of himself now he is retired.  He is to set up for complete exam sometime in May or later.

## 2023-04-26 ENCOUNTER — Telehealth: Payer: Self-pay

## 2023-04-26 DIAGNOSIS — Z1211 Encounter for screening for malignant neoplasm of colon: Secondary | ICD-10-CM

## 2023-04-26 NOTE — Telephone Encounter (Signed)
Copied from CRM [id number]. Topic: Referral - Request for Referral >> Apr 26, 2023 10:22 AM Epimenio Foot F wrote: Did the patient discuss referral with their provider in the last year? Yes   Appointment offered? No  Type of order/referral and detailed reason for visit: Colonoscopy   Preference of office, provider, location: Dr. Loreta Ave   If referral order, have you been seen by this specialty before? No (If Yes, this issue or another issue? When? Where?  Can we respond through MyChart? Yes

## 2023-05-01 ENCOUNTER — Encounter: Payer: Self-pay | Admitting: Internal Medicine

## 2023-05-04 ENCOUNTER — Telehealth: Payer: Self-pay | Admitting: Gastroenterology

## 2023-05-04 NOTE — Telephone Encounter (Signed)
 Good morning Dr. Barron Alvine   Supervising Provider AM 2/28  We received a call from this patient wishing to schedule a colonoscopy with our practice. Patient last had colonoscopy in 12/2017 with Dr. Loreta Ave, since then he would like to be seen with our practice due to being referred and having insurance changes. Records from colonoscopy are in Epic for you to review. Would you please advise on scheduling?  Thank you.

## 2023-05-15 NOTE — Telephone Encounter (Signed)
 Spoke with patient, states he will continue with Dr. Loreta Ave.

## 2023-05-25 ENCOUNTER — Ambulatory Visit (INDEPENDENT_AMBULATORY_CARE_PROVIDER_SITE_OTHER): Admitting: Medical

## 2023-05-25 ENCOUNTER — Ambulatory Visit: Payer: Self-pay | Admitting: *Deleted

## 2023-05-25 VITALS — BP 120/80 | HR 61 | Temp 98.4°F | Wt 161.6 lb

## 2023-05-25 DIAGNOSIS — N4340 Spermatocele of epididymis, unspecified: Secondary | ICD-10-CM

## 2023-05-25 DIAGNOSIS — N5089 Other specified disorders of the male genital organs: Secondary | ICD-10-CM

## 2023-05-25 NOTE — Telephone Encounter (Signed)
  Chief Complaint: scrotal pain worsening  Symptoms: scrotal pain center, not able to sleep at night. Lump/cyst size of walnut. Pain was in back of scrotum now moved to front. Frequency: on going but worsening now  Pertinent Negatives: Patient denies severe pain no fever  Disposition: [] ED /[] Urgent Care (no appt availability in office) / [] Appointment(In office/virtual)/ []  [facility]/ [] Home Care/ [] Refused Recommended Disposition /[]  Mobile Bus/ [x]  Follow-up with PCP Additional Notes:   No available appt with PCP within 24 hours and no other provider in practice until March 25. Please advise . patient would like a call back. Wants to see only PCP.       Copied from CRM [id number]. Topic: Clinical - Red Word Triage >> May 25, 2023  8:10 AM Izetta Dakin wrote: [facility] that prompted transfer to Nurse Triage: cyst in groin area Reason for Disposition  [1] Pain comes and goes (intermittent) AND [2] present > 24 hours  Answer Assessment - Initial Assessment Questions 1. LOCATION and RADIATION: "Where is the pain located?"      Center of scrotum, was in back now pain moved towards front of scrotum 2. QUALITY: "What does the pain feel like?"  (e.g., sharp, dull, aching, burning)     ache 3. SEVERITY: "How bad is the pain?"  (Scale 1-10; or mild, moderate, severe)   - MILD (1-3): doesn't interfere with normal activities    - MODERATE (4-7): interferes with normal activities (e.g., work or school) or awakens from sleep   - SEVERE (8-10): excruciating pain, unable to do any normal activities, difficulty walking     Pain level 5-6 not able to sleep 4. ONSET: "When did the pain start?"     On going has seen provider for this issue 5. PATTERN: "Does it come and go, or has it been constant since it started?"     Comes and goes mostly at sleep 6. SCROTAL APPEARANCE: "What does the scrotum look like?" "Is there any swelling or redness?"      Denies swelling or redness 7.  HERNIA: "Has a doctor ever told you that you have a hernia?"     na 8. OTHER SYMPTOMS: "Do you have any other symptoms?" (e.g., abdomen pain, difficulty passing urine, fever, vomiting)     Scrotal pain  Protocols used: Scrotum Pain-A-AH

## 2023-05-25 NOTE — Progress Notes (Signed)
 Subjective:  Earl Sanchez is a 63 y.o. male who presents for Chief Complaint  Patient presents with   cyst    Cyst on scrotum. Having pain and its causing him having issues with  sleeping. Can feel it sitting     Here for concern of cyst on scrotum.  He notes that he has had a cyst on the right for years and was told years ago if it ever got bigger became a problem then to get checked back out.  It has finally got to that point.  Lately the size of the cyst is grown gradually over the years and now is aggravating him.  It interferes with sleep, he has to use a pillow between his legs to sleep.  He keeps him up at night aggravating and because of the size.  No urinary changes, no concern for STD.  No other pain or irritation or skin changes.  Lately at times the cyst can move to the front or the back.  Otherwise normal state of health  No other aggravating or relieving factors.    No other c/o.  The following portions of the patient's history were reviewed and updated as appropriate: allergies, current medications, past family history, past medical history, past social history, past surgical history and problem list.  ROS Otherwise as in subjective above  Objective: BP 120/80   Pulse 61   Temp 98.4 F (36.9 C)   Wt 161 lb 9.6 oz (73.3 kg)   SpO2 97%   BMI 24.57 kg/m   General appearance: alert, no distress, well developed, well nourished Normal male GU, circumcised, there is a fairly large spermatocele or subcutaneous roundish cystic like lesion in the right scrotum.  Otherwise normal exam, no hernia, no other mass or rash, no lymphadenopathy    Assessment: Encounter Diagnoses  Name Primary?   Scrotal mass Yes   Spermatocele      Plan: We discussed the findings.  No prior ultrasound but he feels like this has been here for years but has just recently gotten to the point where it is more aggravating and interfering with sleep.  Referral to urology for surgical  consult  Earl Sanchez was seen today for cyst.  Diagnoses and all orders for this visit:  Scrotal mass -     Ambulatory referral to Urology  Spermatocele -     Ambulatory referral to Urology    Follow up: pending referral

## 2023-05-25 NOTE — Telephone Encounter (Signed)
 See if patient can come in this morning to see Vincenza Hews.

## 2023-06-06 ENCOUNTER — Encounter: Payer: Self-pay | Admitting: Urology

## 2023-06-06 ENCOUNTER — Ambulatory Visit (INDEPENDENT_AMBULATORY_CARE_PROVIDER_SITE_OTHER): Admitting: Urology

## 2023-06-06 VITALS — BP 160/97 | HR 60 | Ht 68.0 in | Wt 161.0 lb

## 2023-06-06 DIAGNOSIS — N5089 Other specified disorders of the male genital organs: Secondary | ICD-10-CM | POA: Diagnosis not present

## 2023-06-06 DIAGNOSIS — N509 Disorder of male genital organs, unspecified: Secondary | ICD-10-CM

## 2023-06-06 NOTE — Progress Notes (Signed)
 Assessment: 1. Scrotal mass, right - likely epididymal cyst     Plan: I personally reviewed the patient's chart including provider notes. His exam is consistent with a right epididymal cyst. Recommend further evaluation with a scrotal ultrasound. Will contact him with results and recommendations for continued management. Due to the increase in size, he is interested in surgical management. I briefly discussed surgical management with a spermatocelectomy.  Chief Complaint:  Chief Complaint  Patient presents with   Scrotal mass    History of Present Illness:  [name] is a 63 y.o. male who is seen in consultation from Ronnald Nian, MD for evaluation of right scrotal mass.  He initially noted this approximately 18 years ago and was told he had a benign growth in his scrotum.  This area has gradually increased in size.  It is currently of a size that is uncomfortable for him with certain positions and with sleeping.  He does not have any pain associated with the mass.  No scrotal swelling or redness.  No history of scrotal trauma. No prior imaging studies.   Past Medical History:  Past Medical History:  Diagnosis Date   Ankylosing spondylitis (HCC)    Erectile dysfunction    Hypertension    Microscopic hematuria     Past Surgical History:  Past Surgical History:  Procedure Laterality Date   CARDIOVERSION  03/27/2012   Procedure: CARDIOVERSION;  Surgeon: Wendall Stade, MD;  Location: [facility] ENDOSCOPY;  Service: Cardiovascular;  Laterality: N/A;   LYMPH GLAND EXCISION  1989   left neck/benign    Allergies:  No Known Allergies  Family History:  Family History  Problem Relation Age of Onset   Arthritis Mother    Hypertension Mother    Colon polyps Brother    Hypertension Father    Cancer Father     Social History:  Social History   Tobacco Use   Smoking status: Never   Smokeless tobacco: Never  Vaping Use   Vaping status: Never Used  Substance Use  Topics   Alcohol use: Yes    Alcohol/week: 14.0 standard drinks of alcohol    Types: 14 Cans of beer per week   Drug use: No    Review of symptoms:  Constitutional:  Negative for unexplained weight loss, night sweats, fever, chills ENT:  Negative for nose bleeds, sinus pain, painful swallowing CV:  Negative for chest pain, shortness of breath, exercise intolerance, palpitations, loss of consciousness Resp:  Negative for cough, wheezing, shortness of breath GI:  Negative for nausea, vomiting, diarrhea, bloody stools GU:  Positives noted in HPI; otherwise negative for gross hematuria, dysuria, urinary incontinence Neuro:  Negative for seizures, poor balance, limb weakness, slurred speech Psych:  Negative for lack of energy, depression, anxiety Endocrine:  Negative for polydipsia, polyuria, symptoms of hypoglycemia (dizziness, hunger, sweating) Hematologic:  Negative for anemia, purpura, petechia, prolonged or excessive bleeding, use of anticoagulants  Allergic:  Negative for difficulty breathing or choking as a result of exposure to anything; no shellfish allergy; no allergic response (rash/itch) to materials, foods  Physical exam: BP (!) 160/97   Pulse 60   Ht 5\' 8"  (1.727 m)   Wt 161 lb (73 kg)   BMI 24.48 kg/m  GENERAL APPEARANCE:  Well appearing, well developed, well nourished, NAD HEENT: Atraumatic, Normocephalic, oropharynx clear. NECK: Supple without lymphadenopathy or thyromegaly. LUNGS: Clear to auscultation bilaterally. HEART: Regular Rate and Rhythm without murmurs, gallops, or rubs. ABDOMEN: Soft, non-tender, No Masses.  EXTREMITIES: Moves all extremities well.  Without clubbing, cyanosis, or edema. NEUROLOGIC:  Alert and oriented x 3, normal gait, CN II-XII grossly intact.  MENTAL STATUS:  Appropriate. BACK:  Non-tender to palpation.  No CVAT SKIN:  Warm, dry and intact.   GU: Penis:  circumcised Meatus: Normal Scrotum: No erythema or edema; enlargement of the right  scrotum Testis: normal without masses bilateral Epididymis: Soft, cystic mass palpated superior to the right testicle consistent with a right epididymal cyst; left normal   Results: None

## 2023-06-12 ENCOUNTER — Ambulatory Visit (HOSPITAL_BASED_OUTPATIENT_CLINIC_OR_DEPARTMENT_OTHER)
Admission: RE | Admit: 2023-06-12 | Discharge: 2023-06-12 | Disposition: A | Discharge status: Home / Self Care | Source: Ambulatory Visit | Attending: Urology | Admitting: Urology

## 2023-06-12 DIAGNOSIS — N5089 Other specified disorders of the male genital organs: Secondary | ICD-10-CM | POA: Diagnosis present

## 2023-06-29 NOTE — Addendum Note (Signed)
 Addended by: Mellie Sprinkle on: 06/29/2023 01:53 PM   Modules accepted: Orders

## 2023-07-02 ENCOUNTER — Other Ambulatory Visit

## 2023-07-02 DIAGNOSIS — N509 Disorder of male genital organs, unspecified: Secondary | ICD-10-CM

## 2023-07-03 LAB — BETA HCG QUANT (REF LAB): hCG Quant: <1 m[IU]/mL (ref 0–3)

## 2023-07-03 LAB — AFP TUMOR MARKER: AFP, Serum, Tumor Marker: 7 ng/mL (ref 0.0–8.4)

## 2023-07-03 LAB — LACTATE DEHYDROGENASE: LDH: 172 IU/L (ref 121–224)

## 2023-07-04 ENCOUNTER — Encounter: Payer: Self-pay | Admitting: Urology

## 2023-07-16 LAB — HM COLONOSCOPY

## 2023-07-23 ENCOUNTER — Encounter: Payer: Medicaid Other | Admitting: Medical

## 2023-07-24 ENCOUNTER — Encounter: Payer: Self-pay | Admitting: Family Medicine

## 2023-07-24 ENCOUNTER — Ambulatory Visit: Payer: Medicaid Other | Admitting: Family Medicine

## 2023-07-24 VITALS — BP 140/90 | HR 60 | Ht 68.0 in | Wt 159.4 lb

## 2023-07-24 DIAGNOSIS — Z13220 Encounter for screening for lipoid disorders: Secondary | ICD-10-CM

## 2023-07-24 DIAGNOSIS — Z0000 Encounter for general adult medical examination without abnormal findings: Secondary | ICD-10-CM | POA: Diagnosis not present

## 2023-07-24 DIAGNOSIS — N5201 Erectile dysfunction due to arterial insufficiency: Secondary | ICD-10-CM

## 2023-07-24 DIAGNOSIS — M450 Ankylosing spondylitis of multiple sites in spine: Secondary | ICD-10-CM | POA: Diagnosis not present

## 2023-07-24 DIAGNOSIS — I10 Essential (primary) hypertension: Secondary | ICD-10-CM

## 2023-07-24 DIAGNOSIS — R319 Hematuria, unspecified: Secondary | ICD-10-CM

## 2023-07-24 DIAGNOSIS — Z860100 Personal history of colon polyps, unspecified: Secondary | ICD-10-CM

## 2023-07-24 MED ORDER — TADALAFIL 20 MG PO TABS
20.0000 mg | ORAL_TABLET | Freq: Every day | ORAL | 2 refills | Status: AC | PRN
Start: 2023-07-24 — End: ?

## 2023-07-24 NOTE — Progress Notes (Addendum)
 Complete physical exam  Patient: Earl Sanchez   DOB: 05-07-1960   63 y.o. Male  MRN: [id number]  Subjective:     Chief Complaint  Patient presents with   Annual Exam    Fasting annual exam, no new concerns. Would like to defer vaccines to another time, will consider at later date.     Earl Sanchez is a 63 y.o. male who presents today for a complete physical exam.  He reports consuming a general diet. Exercise 5 days a week, cardio and weights. He generally feels well. He reports sleeping well.  He does have a history of ankylosing spondylitis but is presently not having any trouble with it.  He continues on lisinopril  and is having no difficulty from that.  He also recently had a colonoscopy and is scheduled again in 5 years.  He is seeing urology for a scrotal mass and apparently is scheduled for an MRI and then probable excision of the lesion.  He has had some difficulty with the ED and in the past was given Cialis .  He would like a refill on this.  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  Life in general is going quite well for him right now.  He does drink probably 2 beers per night. The 10-year ASCVD risk score (Arnett DK, et al., 2019) is: 16.4%   Values used to calculate the score:     Age: 69 years     Sex: Male     Is Non-Hispanic African American: Yes     Diabetic: No     Tobacco smoker: No     Systolic Blood Pressure: 140 mmHg     Is BP treated: Yes     HDL Cholesterol: 83 mg/dL     Total Cholesterol: 230 mg/dL  Most recent fall risk assessment:    07/24/2023   10:55 AM  Fall Risk   Falls in the past year? 0  Number falls in past yr: 0  Injury with Fall? 0  Risk for fall due to : No Fall Risks  Follow up Falls evaluation completed     Most recent depression screenings:    07/24/2023   10:55 AM 04/19/2023    8:41 AM  PHQ 2/9 Scores  PHQ - 2 Score 0 0    Vision:Within last  year    Immunization History  Administered Date(s) Administered   Hepatitis B 01/04/2012, 03/26/2012   Hepatitis B, ADULT 05/11/2014   Hepatitis B, PED/ADOLESCENT 05/11/2014   Influenza Split 02/20/2011, 01/04/2012   Influenza,inj,Quad PF,6+ Mos 10/23/2017, 12/16/2018   Influenza-Unspecified 01/17/2017, 10/23/2017, 12/19/2018   Moderna Sars-Covid-2 Vaccination 06/19/2019, 07/10/2019, 05/04/2020   Td (Adult),5 Lf Tetanus Toxid, Preservative Free 07/29/2021   Tdap 09/12/2010, 11/10/2020    Health Maintenance  Topic Date Due   Hepatitis C Screening  Never done   Zoster Vaccines- Shingrix (1 of 2) Never done   COVID-19 Vaccine (4 - 2024-25 season) 11/05/2022   INFLUENZA VACCINE  10/05/2023   Colonoscopy  07/15/2028   DTaP/Tdap/Td (4 - Td or Tdap) 07/30/2031   HIV Screening  Completed   HPV VACCINES  Aged Out   Meningococcal B Vaccine  Aged Out    Patient Care Team: Watson Hacking, MD as PCP - General (Family Medicine)  [facility]o: [facility] [place] Dentist: Seeking new dentist due to new insurance GI: Dr. Tova Fresh Urology: Dr. Terald Fells: Annamae Barrett  Cardio, Nishan-2014     Outpatient Medications Prior to Visit  Medication Sig   lisinopril  (ZESTRIL ) 10 MG tablet Take 1 tablet (10 mg total) by mouth daily.   Multiple Vitamins-Minerals (ONE-A-DAY MENS 50+) TABS Take 1 tablet by mouth daily.   No facility-administered medications prior to visit.    Review of Systems  All other systems reviewed and are negative.   Family and social history as well as health maintenance and immunizations was reviewed.     Objective:    BP (!) 140/90   Pulse 60   Ht 5\' 8"  (1.727 m)   Wt 159 lb 6.4 oz (72.3 kg)   SpO2 96%   BMI 24.24 kg/m    Physical Exam  Alert and in no distress. Tympanic membranes and canals are normal. Pharyngeal area is normal. Neck is supple without adenopathy or thyromegaly. Cardiac exam shows a regular sinus rhythm without murmurs or gallops. Lungs are  clear to auscultation.      Assessment & Plan:    Routine general medical examination at a health care facility  Primary hypertension - Plan: CBC with Differential/Platelet, Comprehensive metabolic panel with GFR  History of colonic polyps  Ankylosing spondylitis of multiple sites in spine [facility])  Erectile dysfunction due to arterial insufficiency - Plan: tadalafil  (CIALIS ) 20 MG tablet  Hematuria of undiagnosed cause  Screening for lipid disorders - Plan: Lipid panel   Return in about 1 year (around 07/23/2024).      Ron Cobbs, MD

## 2023-07-25 ENCOUNTER — Ambulatory Visit: Payer: Self-pay | Admitting: Family Medicine

## 2023-07-25 LAB — CBC WITH DIFFERENTIAL/PLATELET
Basophils Absolute: 0 10*3/uL (ref 0.0–0.2)
Basos: 0 %
EOS (ABSOLUTE): 0 10*3/uL (ref 0.0–0.4)
Eos: 1 %
Hematocrit: 42.8 % (ref 37.5–51.0)
Hemoglobin: 14.3 g/dL (ref 13.0–17.7)
Immature Grans (Abs): 0 10*3/uL (ref 0.0–0.1)
Immature Granulocytes: 0 %
Lymphocytes Absolute: 1.7 10*3/uL (ref 0.7–3.1)
Lymphs: 37 %
MCH: 29.4 pg (ref 26.6–33.0)
MCHC: 33.4 g/dL (ref 31.5–35.7)
MCV: 88 fL (ref 79–97)
Monocytes Absolute: 0.5 10*3/uL (ref 0.1–0.9)
Monocytes: 12 %
Neutrophils Absolute: 2.2 10*3/uL (ref 1.4–7.0)
Neutrophils: 50 %
Platelets: 219 10*3/uL (ref 150–450)
RBC: 4.87 x10E6/uL (ref 4.14–5.80)
RDW: 13.1 % (ref 11.6–15.4)
WBC: 4.5 10*3/uL (ref 3.4–10.8)

## 2023-07-25 LAB — LIPID PANEL
Chol/HDL Ratio: 2.8 ratio (ref 0.0–5.0)
Cholesterol, Total: 230 mg/dL — ABNORMAL HIGH (ref 100–199)
HDL: 83 mg/dL (ref >39)
LDL Chol Calc (NIH): 139 mg/dL — ABNORMAL HIGH (ref 0–99)
Triglycerides: 48 mg/dL (ref 0–149)
VLDL Cholesterol Cal: 8 mg/dL (ref 5–40)

## 2023-07-25 LAB — COMPREHENSIVE METABOLIC PANEL WITH GFR
ALT: 15 IU/L (ref 0–44)
AST: 23 IU/L (ref 0–40)
Albumin: 4.8 g/dL (ref 3.9–4.9)
Alkaline Phosphatase: 60 IU/L (ref 44–121)
BUN/Creatinine Ratio: 15 (ref 10–24)
BUN: 13 mg/dL (ref 8–27)
Bilirubin Total: 1.1 mg/dL (ref 0.0–1.2)
CO2: 22 mmol/L (ref 20–29)
Calcium: 9.9 mg/dL (ref 8.6–10.2)
Chloride: 100 mmol/L (ref 96–106)
Creatinine, Ser: 0.86 mg/dL (ref 0.76–1.27)
Globulin, Total: 2.4 g/dL (ref 1.5–4.5)
Glucose: 99 mg/dL (ref 70–99)
Potassium: 4.5 mmol/L (ref 3.5–5.2)
Sodium: 137 mmol/L (ref 134–144)
Total Protein: 7.2 g/dL (ref 6.0–8.5)
eGFR: 97 mL/min/{1.73_m2} (ref >59)

## 2023-07-26 ENCOUNTER — Telehealth: Payer: Self-pay

## 2023-07-26 NOTE — Telephone Encounter (Signed)
 Copied from CRM [id number]. Topic: Clinical - Lab/Test Results >> Jul 26, 2023 10:45 AM Zipporah Him wrote: Reason for CRM: Patient calling to speak with dr Melvinia Stager nurse in regard to his recent labs, physical, and has questions for her. Please call when available.

## 2023-07-29 ENCOUNTER — Ambulatory Visit
Admission: RE | Admit: 2023-07-29 | Discharge: 2023-07-29 | Disposition: A | Discharge status: Home / Self Care | Source: Ambulatory Visit | Attending: Urology | Admitting: Urology

## 2023-07-29 DIAGNOSIS — N509 Disorder of male genital organs, unspecified: Secondary | ICD-10-CM

## 2023-07-29 MED ORDER — GADOPICLENOL 0.5 MMOL/ML IV SOLN
8.0000 mL | Freq: Once | INTRAVENOUS | Status: AC | PRN
  Administered 2023-07-29: 8 mL via INTRAVENOUS

## 2023-08-01 ENCOUNTER — Telehealth: Payer: Self-pay

## 2023-08-01 NOTE — Telephone Encounter (Signed)
 There is another message from [place] about pt. Asking about PSA and HIV testing. I do not see were that was checked at his last appt.   Copied from CRM [id number]. Topic: Clinical - Lab/Test Results >> Aug 01, 2023 11:09 AM El Gravely T wrote: Reason for CRM: Patient calling back to inquire on previous missing lab/test results.  Contacted [facility] for a nurse to discuss further, per [facility] sending CRM to clinical pool to discuss further.   Please call patient back at [phone]

## 2023-08-06 ENCOUNTER — Ambulatory Visit: Payer: Self-pay | Admitting: Urology

## 2023-08-06 NOTE — Telephone Encounter (Signed)
 complete

## 2023-08-15 ENCOUNTER — Other Ambulatory Visit: Payer: Self-pay

## 2023-08-15 ENCOUNTER — Telehealth: Payer: Self-pay

## 2023-08-15 DIAGNOSIS — Z114 Encounter for screening for human immunodeficiency virus [HIV]: Secondary | ICD-10-CM

## 2023-08-15 DIAGNOSIS — Z125 Encounter for screening for malignant neoplasm of prostate: Secondary | ICD-10-CM

## 2023-08-15 DIAGNOSIS — Z13220 Encounter for screening for lipoid disorders: Secondary | ICD-10-CM

## 2023-08-15 DIAGNOSIS — I10 Essential (primary) hypertension: Secondary | ICD-10-CM

## 2023-08-15 NOTE — Telephone Encounter (Signed)
 I do not see any orders in and last lab results did not mention anything about rechecking in two months. It did mention about starting cholesterol medication but did not see that the pt. Responded or is on one currently.   Copied from CRM [id number]. Topic: Clinical - Lab/Test Results >> Aug 15, 2023  9:59 AM Carlatta H wrote: Reason for CRM: Patient would like to schedule 2 month labs but there are not orders in chart//

## 2023-09-03 ENCOUNTER — Ambulatory Visit: Payer: Medicaid Other | Admitting: Internal Medicine

## 2023-09-11 ENCOUNTER — Other Ambulatory Visit: Payer: Self-pay | Admitting: Urology

## 2023-09-11 ENCOUNTER — Telehealth: Payer: Self-pay | Admitting: Urology

## 2023-09-11 DIAGNOSIS — N4340 Spermatocele of epididymis, unspecified: Secondary | ICD-10-CM

## 2023-09-11 NOTE — Telephone Encounter (Signed)
 Pt called to check on a procedure that he was supposed to get a call to schedule. He stated it was to get a cyst in his groin removed. Can You look into this for me. Please Advise.

## 2023-09-14 ENCOUNTER — Telehealth: Payer: Self-pay | Admitting: Urology

## 2023-09-14 NOTE — Telephone Encounter (Signed)
 Pt called and lvm for [facility] about missing her call about scheduling a procedure. Please advise.

## 2023-09-17 NOTE — Telephone Encounter (Signed)
 Spoke with pt in reference to all upcoming appts with BS related to surgery. Pt requested a mychart message be sent with those appts as well. Pt voiced understanding. Mychart message sent.

## 2023-09-18 ENCOUNTER — Ambulatory Visit: Payer: Self-pay | Admitting: Urology

## 2023-09-18 DIAGNOSIS — N4340 Spermatocele of epididymis, unspecified: Secondary | ICD-10-CM

## 2023-09-24 ENCOUNTER — Other Ambulatory Visit

## 2023-09-24 DIAGNOSIS — I10 Essential (primary) hypertension: Secondary | ICD-10-CM

## 2023-09-24 DIAGNOSIS — Z13220 Encounter for screening for lipoid disorders: Secondary | ICD-10-CM

## 2023-09-24 DIAGNOSIS — Z125 Encounter for screening for malignant neoplasm of prostate: Secondary | ICD-10-CM

## 2023-09-25 ENCOUNTER — Telehealth: Payer: Self-pay | Admitting: Urology

## 2023-09-25 ENCOUNTER — Ambulatory Visit: Payer: Self-pay | Admitting: Family Medicine

## 2023-09-25 DIAGNOSIS — E782 Mixed hyperlipidemia: Secondary | ICD-10-CM

## 2023-09-25 LAB — LIPID PANEL
Chol/HDL Ratio: 2.9 ratio (ref 0.0–5.0)
Cholesterol, Total: 220 mg/dL — ABNORMAL HIGH (ref 100–199)
HDL: 75 mg/dL (ref >39)
LDL Chol Calc (NIH): 136 mg/dL — ABNORMAL HIGH (ref 0–99)
Triglycerides: 50 mg/dL (ref 0–149)
VLDL Cholesterol Cal: 9 mg/dL (ref 5–40)

## 2023-09-25 LAB — PSA: Prostate Specific Ag, Serum: 1.8 ng/mL (ref 0.0–4.0)

## 2023-09-25 MED ORDER — ATORVASTATIN CALCIUM 20 MG PO TABS: 20.0000 mg | ORAL_TABLET | Freq: Every day | ORAL | 3 refills | Status: AC

## 2023-09-25 NOTE — Telephone Encounter (Signed)
 Patient called to cancel PRE_OP appointment with our office due to have a PRE_ADMISSION testing appointment with [facility] on the same day on 07/29. I let the patient know that he needs to keep this appointment in order to have surgery. Patient does not believe he needs both and wants this one cancelled.   If someone could please call him and explain the difference and why he needs to keep the pre-op appointment with our office in order to have the surgery.   As of now I did not cancel the appointment but I'm sure patient will be calling and wondering why.

## 2023-09-26 NOTE — Telephone Encounter (Signed)
 Spoke with pt in reference to need pre op appt with Dr. Roseann. Reinforced the importance of the appt for surgery. Pt stated that he has the same appt at [facility] the same day. Reinforced with pt that he requested the appts be on the same day due to his work schedule in a previous conversation, reinforced with pt that [facility] also has protocols they follow for surgeries that are different from our office, also reinforced with pt that he has not been seen since April and a lot can change in 22mo with his condition, therefore Dr. Roseann will have to see him or surgery will be cancelled. Also made pt aware that all of these appts are required and if not followed then surgery will not happen. Pt was not happy but agreed to come to all appts as scheduled. Reinforced that if he misses an appt, but shows up the day so surgery, surgery still will not happen. Pt voiced understanding. Reviewed all dates and times with pt.

## 2023-10-01 NOTE — Patient Instructions (Signed)
 SURGICAL WAITING ROOM VISITATION  Patients having surgery or a procedure may have no more than 2 support people in the waiting area - these visitors may rotate.    Children under the age of 65 must have an adult with them who is not the patient.  Visitors with respiratory illnesses are discouraged from visiting and should remain at home.  If the patient needs to stay at the hospital during part of their recovery, the visitor guidelines for inpatient rooms apply. Pre-op nurse will coordinate an appropriate time for 1 support person to accompany patient in pre-op.  This support person may not rotate.    Please refer to the [facility] website for the visitor guidelines for Inpatients (after your surgery is over and you are in a regular room).       Your procedure is scheduled on: 10/16/23   Report to [facility] Main Entrance    Report to admitting at 11:30 AM   Call this number if you have problems the morning of surgery [phone]   Do not eat food :After Midnight.   After Midnight you may have the following liquids until  7:45 AM DAY OF SURGERY  Water Gatorade (no red)      Oral Hygiene is also important to reduce your risk of infection.                                    Remember - BRUSH YOUR TEETH THE MORNING OF SURGERY WITH YOUR REGULAR TOOTHPASTE  DENTURES WILL BE REMOVED PRIOR TO SURGERY PLEASE DO NOT APPLY Poly grip OR ADHESIVES!!!   Do NOT smoke after Midnight   Stop all vitamins and herbal supplements 7 days before surgery.   Take these medicines the morning of surgery with A SIP OF WATER: atorvastatin              You may not have any metal on your body including hair pins, jewelry, and body piercing             Do not wear make-up, lotions, powders, perfumes/cologne, or deodorant              Men may shave face and neck.   Do not bring valuables to the hospital. [facility] IS NOT             RESPONSIBLE   FOR VALUABLES.   Contacts, glasses,  dentures or bridgework may not be worn into surgery.  DO NOT BRING YOUR HOME MEDICATIONS TO THE HOSPITAL. PHARMACY WILL DISPENSE MEDICATIONS LISTED ON YOUR MEDICATION LIST TO YOU DURING YOUR ADMISSION IN THE HOSPITAL!    Patients discharged on the day of surgery will not be allowed to drive home.  Someone NEEDS to stay with you for the first 24 hours after anesthesia.   Special Instructions: Bring a copy of your healthcare power of attorney and living will documents the day of surgery if you haven't scanned them before.              Please read over the following fact sheets you were given: IF YOU HAVE QUESTIONS ABOUT YOUR PRE-OP INSTRUCTIONS PLEASE CALL [phone] [place]   If you received a COVID test during your pre-op visit  it is requested that you wear a mask when out in public, stay away from anyone that may not be feeling well and notify your surgeon if you develop symptoms. If you test  positive for Covid or have been in contact with anyone that has tested positive in the last 10 days please notify you surgeon.    [facility] - Preparing for Surgery Before surgery, you can play an important role.  Because skin is not sterile, your skin needs to be as free of germs as possible.  You can reduce the number of germs on your skin by washing with CHG (chlorahexidine gluconate) soap before surgery.  CHG is an antiseptic cleaner which kills germs and bonds with the skin to continue killing germs even after washing. Please DO NOT use if you have an allergy to CHG or antibacterial soaps.  If your skin becomes reddened/irritated stop using the CHG and inform your nurse when you arrive at Short Stay. Do not shave (including legs and underarms) for at least 48 hours prior to the first CHG shower.  You may shave your face/neck.  Please follow these instructions carefully:  1.  Shower with CHG Soap the night before surgery and the  morning of surgery.  2.  If you choose to wash your hair, wash your hair  first as usual with your normal  shampoo.  3.  After you shampoo, rinse your hair and body thoroughly to remove the shampoo.                             4.  Use CHG as you would any other liquid soap.  You can apply chg directly to the skin and wash.  Gently with a scrungie or clean washcloth.  5.  Apply the CHG Soap to your body ONLY FROM THE NECK DOWN.   Do   not use on face/ open                           Wound or open sores. Avoid contact with eyes, ears mouth and   genitals (private parts).                       Wash face,  Genitals (private parts) with your normal soap.             6.  Wash thoroughly, paying special attention to the area where your    surgery  will be performed.  7.  Thoroughly rinse your body with warm water from the neck down.  8.  DO NOT shower/wash with your normal soap after using and rinsing off the CHG Soap.                9.  Pat yourself dry with a clean towel.            10.  Wear clean pajamas.            11.  Place clean sheets on your bed the night of your first shower and do not  sleep with pets. Day of Surgery : Do not apply any lotions/deodorants the morning of surgery.  Please wear clean clothes to the hospital/surgery center.  FAILURE TO FOLLOW THESE INSTRUCTIONS MAY RESULT IN THE CANCELLATION OF YOUR SURGERY  PATIENT SIGNATURE_________________________________  NURSE SIGNATURE__________________________________  ________________________________________________________________________

## 2023-10-01 NOTE — Progress Notes (Signed)
 COVID Vaccine received:  []  No [x]  Yes Date of any COVID positive Test in last 90 days: no PCP - Dr. Norleen Jobs Cardiologist - n/a  Chest x-ray -  EKG -   Stress Test -  ECHO -  Cardiac Cath -   Bowel Prep - []  No  []   Yes ______  Pacemaker / ICD device [x]  No []  Yes   Spinal Cord Stimulator:[x]  No []  Yes       History of Sleep Apnea? [x]  No []  Yes   CPAP used?- [x]  No []  Yes    Does the patient monitor blood sugar?          [x]  No []  Yes  []  N/A  Patient has: [x]  NO Hx DM   []  Pre-DM                 []  DM1  []   DM2 Does patient have a [employer] or [employer]? []  No []  Yes   Fasting Blood Sugar Ranges-  Checks Blood Sugar _____ times a day  GLP1 agonist / usual dose - no GLP1 instructions:  SGLT-2 inhibitors / usual dose - no SGLT-2 instructions:   Blood Thinner / Instructions:no Aspirin Instructions:no  Comments:   Activity level: Patient is able  to climb a flight of stairs without difficulty; [x]  No CP  [x]  No SOB,    Patient can  perform ADLs without assistance.   Anesthesia review:   Patient denies shortness of breath, fever, cough and chest pain at PAT appointment.  Patient verbalized understanding and agreement to the Pre-Surgical Instructions that were given to them at this PAT appointment. Patient was also educated of the need to review these PAT instructions again prior to his/her surgery.I reviewed the appropriate phone numbers to call if they have any and questions or concerns.

## 2023-10-02 ENCOUNTER — Encounter (HOSPITAL_COMMUNITY): Payer: Self-pay

## 2023-10-02 ENCOUNTER — Ambulatory Visit (INDEPENDENT_AMBULATORY_CARE_PROVIDER_SITE_OTHER): Admitting: Urology

## 2023-10-02 ENCOUNTER — Encounter: Payer: Self-pay | Admitting: Urology

## 2023-10-02 ENCOUNTER — Other Ambulatory Visit: Payer: Self-pay

## 2023-10-02 ENCOUNTER — Encounter (HOSPITAL_COMMUNITY)
Admission: RE | Admit: 2023-10-02 | Discharge: 2023-10-02 | Disposition: A | Discharge status: Home / Self Care | Source: Ambulatory Visit | Attending: Urology | Admitting: Urology

## 2023-10-02 VITALS — BP 136/96 | HR 56 | Temp 98.2°F | Resp 16 | Ht 68.0 in | Wt 156.0 lb

## 2023-10-02 VITALS — BP 168/94 | HR 60 | Ht 68.0 in | Wt 159.0 lb

## 2023-10-02 DIAGNOSIS — I10 Essential (primary) hypertension: Secondary | ICD-10-CM | POA: Insufficient documentation

## 2023-10-02 DIAGNOSIS — Z01818 Encounter for other preprocedural examination: Secondary | ICD-10-CM | POA: Insufficient documentation

## 2023-10-02 DIAGNOSIS — N4340 Spermatocele of epididymis, unspecified: Secondary | ICD-10-CM | POA: Diagnosis not present

## 2023-10-02 HISTORY — DX: Cardiac arrhythmia, unspecified: I49.9

## 2023-10-02 LAB — BASIC METABOLIC PANEL WITH GFR
Anion gap: 8 (ref 5–15)
BUN: 20 mg/dL (ref 8–23)
CO2: 26 mmol/L (ref 22–32)
Calcium: 9.2 mg/dL (ref 8.9–10.3)
Chloride: 100 mmol/L (ref 98–111)
Creatinine, Ser: 0.74 mg/dL (ref 0.61–1.24)
GFR, Estimated: >60 mL/min (ref >60)
Glucose, Bld: 102 mg/dL — ABNORMAL HIGH (ref 70–99)
Potassium: 4.3 mmol/L (ref 3.5–5.1)
Sodium: 134 mmol/L — ABNORMAL LOW (ref 135–145)

## 2023-10-02 LAB — URINALYSIS, ROUTINE W REFLEX MICROSCOPIC
Bilirubin, UA: NEGATIVE
Glucose, UA: NEGATIVE
Ketones, UA: NEGATIVE
Leukocytes,UA: NEGATIVE
Nitrite, UA: NEGATIVE
Protein,UA: NEGATIVE
Specific Gravity, UA: <1.005 — ABNORMAL LOW (ref 1.005–1.030)
Urobilinogen, Ur: 0.2 mg/dL (ref 0.2–1.0)
pH, UA: 6 (ref 5.0–7.5)

## 2023-10-02 LAB — CBC
HCT: 40.2 % (ref 39.0–52.0)
Hemoglobin: 13.3 g/dL (ref 13.0–17.0)
MCH: 29 pg (ref 26.0–34.0)
MCHC: 33.1 g/dL (ref 30.0–36.0)
MCV: 87.6 fL (ref 80.0–100.0)
Platelets: 192 K/uL (ref 150–400)
RBC: 4.59 MIL/uL (ref 4.22–5.81)
RDW: 13.4 % (ref 11.5–15.5)
WBC: 5.5 K/uL (ref 4.0–10.5)
nRBC: 0 % (ref 0.0–0.2)

## 2023-10-02 LAB — MICROSCOPIC EXAMINATION: Bacteria, UA: NONE SEEN

## 2023-10-02 NOTE — Progress Notes (Signed)
 Assessment: 1. Spermatocele, right     Plan: I again discussed options for management of the right spermatocele including observation and surgical removal with spermatocelectomy.  He would like to proceed with a right spermatocelectomy.  Procedure: The patient will be scheduled for right spermatocelectomy at [facility].  Surgical request is placed with the surgery schedulers and will be scheduled at the patient's/family request. Informed consent is given as documented below. Anesthesia: General  The patient does not have sleep apnea, history of MRSA, history of VRE, history of cardiac device requiring special anesthetic needs. Patient is stable and considered clear for surgical management in an outpatient ambulatory surgery setting as well as inpatient hospital setting.  Consent for Operation or Procedure: Provider Certification I hereby certify that the nature, purpose, benefits, usual and most frequent risks of, and alternatives to, the operation or procedure have been explained to the patient (or person authorized to sign for the patient) either by me as responsible physician or by the provider who is to perform the operation or procedure. Time spent such that the patient/family has had an opportunity to ask questions, and that those questions have been answered. The patient or the patient's representative has been advised that selected tasks may be performed by assistants to the primary health care provider(s). I believe that the patient (or person authorized to sign for the patient) understands what has been explained, and has consented to the operation or procedure. No guarantees were implied or made.   Chief Complaint:  Chief Complaint  Patient presents with   Spermatocele    History of Present Illness:  Earl Sanchez is a 63 y.o. male who is seen for further evaluation of right scrotal mass.  He initially noted this approximately 18 years ago and was told he had a benign  growth in his scrotum.  This area has gradually increased in size.  It is currently of a size that is uncomfortable for him with certain positions and with sleeping.  He does not have any pain associated with the mass.  No scrotal swelling or redness.  No history of scrotal trauma.  Scrotal U/S from 4/25 showed a large septated cyst on the right, 4.8 x 6.1 x 2.8 cm in size, normal right testis, and left testis with several hypoechoic areas. MRI from 5/25 showed normal testicles bilaterally, large right epididymal cyst.  He is scheduled for right spermatocelectomy. No change in the scrotal swelling.  No new symptoms.  He is not having any lower urinary tract symptoms at this time. IPSS = 4/1.  Portions of the above documentation were copied from a prior visit for review purposes only.   Past Medical History:  Past Medical History:  Diagnosis Date   Ankylosing spondylitis (HCC)    Erectile dysfunction    Hypertension    Microscopic hematuria     Past Surgical History:  Past Surgical History:  Procedure Laterality Date   CARDIOVERSION  03/27/2012   Procedure: CARDIOVERSION;  Surgeon: Maude JAYSON Emmer, MD;  Location: [facility] ENDOSCOPY;  Service: Cardiovascular;  Laterality: N/A;   LYMPH GLAND EXCISION  1989   left neck/benign    Allergies:  No Known Allergies  Family History:  Family History  Problem Relation Age of Onset   Arthritis Mother    Hypertension Mother    Colon polyps Brother    Hypertension Father    Cancer Father     Social History:  Social History   Tobacco Use   Smoking status:  Never   Smokeless tobacco: Never  Vaping Use   Vaping status: Never Used  Substance Use Topics   Alcohol use: Yes    Alcohol/week: 14.0 standard drinks of alcohol    Types: 14 Cans of beer per week   Drug use: No    ROS: Constitutional:  Negative for fever, chills, weight loss CV: Negative for chest pain, previous MI, hypertension Respiratory:  Negative for shortness of breath,  wheezing, sleep apnea, frequent cough GI:  Negative for nausea, vomiting, bloody stool, GERD  Physical exam: BP (!) 168/94   Pulse 60   Ht 5' 8 (1.727 m)   Wt 159 lb (72.1 kg)   BMI 24.18 kg/m  GENERAL APPEARANCE:  Well appearing, well developed, well nourished, NAD HEENT: Atraumatic, Normocephalic, oropharynx clear. NECK: Supple without lymphadenopathy or thyromegaly. LUNGS: Clear to auscultation bilaterally. HEART: Regular Rate and Rhythm without murmurs, gallops, or rubs. ABDOMEN: Soft, non-tender, No Masses. EXTREMITIES: Moves all extremities well.  Without clubbing, cyanosis, or edema. NEUROLOGIC:  Alert and oriented x 3, normal gait, CN II-XII grossly intact.  MENTAL STATUS:  Appropriate. BACK:  Non-tender to palpation.  No CVAT SKIN:  Warm, dry and intact.   GU: Penis:  circumcised Meatus: Normal Scrotum: No erythema or edema; enlargement of the right scrotum Testis: normal without masses bilateral Epididymis: Soft, cystic mass palpated superior to the right testicle consistent with a right epididymal cyst; left normal   Results: U/A: 0-5 WBCs, 0-2 RBCs

## 2023-10-02 NOTE — H&P (View-Only) (Signed)
 Assessment: 1. Spermatocele, right     Plan: I again discussed options for management of the right spermatocele including observation and surgical removal with spermatocelectomy.  He would like to proceed with a right spermatocelectomy.  Procedure: The patient will be scheduled for right spermatocelectomy at [facility].  Surgical request is placed with the surgery schedulers and will be scheduled at the patient's/family request. Informed consent is given as documented below. Anesthesia: General  The patient does not have sleep apnea, history of MRSA, history of VRE, history of cardiac device requiring special anesthetic needs. Patient is stable and considered clear for surgical management in an outpatient ambulatory surgery setting as well as inpatient hospital setting.  Consent for Operation or Procedure: Provider Certification I hereby certify that the nature, purpose, benefits, usual and most frequent risks of, and alternatives to, the operation or procedure have been explained to the patient (or person authorized to sign for the patient) either by me as responsible physician or by the provider who is to perform the operation or procedure. Time spent such that the patient/family has had an opportunity to ask questions, and that those questions have been answered. The patient or the patient's representative has been advised that selected tasks may be performed by assistants to the primary health care provider(s). I believe that the patient (or person authorized to sign for the patient) understands what has been explained, and has consented to the operation or procedure. No guarantees were implied or made.   Chief Complaint:  Chief Complaint  Patient presents with   Spermatocele    History of Present Illness:  Earl Sanchez is a 63 y.o. male who is seen for further evaluation of right scrotal mass.  He initially noted this approximately 18 years ago and was told he had a benign  growth in his scrotum.  This area has gradually increased in size.  It is currently of a size that is uncomfortable for him with certain positions and with sleeping.  He does not have any pain associated with the mass.  No scrotal swelling or redness.  No history of scrotal trauma.  Scrotal U/S from 4/25 showed a large septated cyst on the right, 4.8 x 6.1 x 2.8 cm in size, normal right testis, and left testis with several hypoechoic areas. MRI from 5/25 showed normal testicles bilaterally, large right epididymal cyst.  He is scheduled for right spermatocelectomy. No change in the scrotal swelling.  No new symptoms.  He is not having any lower urinary tract symptoms at this time. IPSS = 4/1.  Portions of the above documentation were copied from a prior visit for review purposes only.   Past Medical History:  Past Medical History:  Diagnosis Date   Ankylosing spondylitis (HCC)    Erectile dysfunction    Hypertension    Microscopic hematuria     Past Surgical History:  Past Surgical History:  Procedure Laterality Date   CARDIOVERSION  03/27/2012   Procedure: CARDIOVERSION;  Surgeon: Maude JAYSON Emmer, MD;  Location: [facility] ENDOSCOPY;  Service: Cardiovascular;  Laterality: N/A;   LYMPH GLAND EXCISION  1989   left neck/benign    Allergies:  No Known Allergies  Family History:  Family History  Problem Relation Age of Onset   Arthritis Mother    Hypertension Mother    Colon polyps Brother    Hypertension Father    Cancer Father     Social History:  Social History   Tobacco Use   Smoking status:  Never   Smokeless tobacco: Never  Vaping Use   Vaping status: Never Used  Substance Use Topics   Alcohol use: Yes    Alcohol/week: 14.0 standard drinks of alcohol    Types: 14 Cans of beer per week   Drug use: No    ROS: Constitutional:  Negative for fever, chills, weight loss CV: Negative for chest pain, previous MI, hypertension Respiratory:  Negative for shortness of breath,  wheezing, sleep apnea, frequent cough GI:  Negative for nausea, vomiting, bloody stool, GERD  Physical exam: BP (!) 168/94   Pulse 60   Ht 5' 8 (1.727 m)   Wt 159 lb (72.1 kg)   BMI 24.18 kg/m  GENERAL APPEARANCE:  Well appearing, well developed, well nourished, NAD HEENT: Atraumatic, Normocephalic, oropharynx clear. NECK: Supple without lymphadenopathy or thyromegaly. LUNGS: Clear to auscultation bilaterally. HEART: Regular Rate and Rhythm without murmurs, gallops, or rubs. ABDOMEN: Soft, non-tender, No Masses. EXTREMITIES: Moves all extremities well.  Without clubbing, cyanosis, or edema. NEUROLOGIC:  Alert and oriented x 3, normal gait, CN II-XII grossly intact.  MENTAL STATUS:  Appropriate. BACK:  Non-tender to palpation.  No CVAT SKIN:  Warm, dry and intact.   GU: Penis:  circumcised Meatus: Normal Scrotum: No erythema or edema; enlargement of the right scrotum Testis: normal without masses bilateral Epididymis: Soft, cystic mass palpated superior to the right testicle consistent with a right epididymal cyst; left normal   Results: U/A: 0-5 WBCs, 0-2 RBCs

## 2023-10-03 ENCOUNTER — Ambulatory Visit: Admitting: Urology

## 2023-10-16 ENCOUNTER — Ambulatory Visit (HOSPITAL_COMMUNITY): Payer: Self-pay | Admitting: Physician Assistant

## 2023-10-16 ENCOUNTER — Ambulatory Visit (HOSPITAL_COMMUNITY)
Admission: RE | Admit: 2023-10-16 | Discharge: 2023-10-16 | Disposition: A | Discharge status: Home / Self Care | Attending: Urology | Admitting: Urology

## 2023-10-16 ENCOUNTER — Other Ambulatory Visit: Payer: Self-pay

## 2023-10-16 ENCOUNTER — Encounter (HOSPITAL_COMMUNITY)
Admission: RE | Disposition: A | Discharge status: Home / Self Care | Payer: Self-pay | Source: Home / Self Care | Attending: Urology

## 2023-10-16 ENCOUNTER — Ambulatory Visit (HOSPITAL_COMMUNITY): Admitting: Anesthesiology

## 2023-10-16 ENCOUNTER — Encounter (HOSPITAL_COMMUNITY): Payer: Self-pay | Admitting: Urology

## 2023-10-16 DIAGNOSIS — I10 Essential (primary) hypertension: Secondary | ICD-10-CM | POA: Diagnosis not present

## 2023-10-16 DIAGNOSIS — N4340 Spermatocele of epididymis, unspecified: Secondary | ICD-10-CM

## 2023-10-16 DIAGNOSIS — M459 Ankylosing spondylitis of unspecified sites in spine: Secondary | ICD-10-CM | POA: Insufficient documentation

## 2023-10-16 DIAGNOSIS — N4341 Spermatocele of epididymis, single: Secondary | ICD-10-CM | POA: Diagnosis not present

## 2023-10-16 HISTORY — PX: SPERMATOCELECTOMY: SHX2420

## 2023-10-16 SURGERY — EXCISION, SPERMATOCELE
Anesthesia: General | Site: Penis | Laterality: Right

## 2023-10-16 MED ORDER — ONDANSETRON HCL 4 MG/2ML IJ SOLN
INTRAMUSCULAR | Status: AC
  Filled 2023-10-16: qty 2

## 2023-10-16 MED ORDER — ONDANSETRON HCL 4 MG/2ML IJ SOLN: 4.0000 mg | Freq: Once | INTRAMUSCULAR | Status: DC | PRN

## 2023-10-16 MED ORDER — ORAL CARE MOUTH RINSE: 15.0000 mL | Freq: Once | OROMUCOSAL | Status: AC

## 2023-10-16 MED ORDER — DROPERIDOL 2.5 MG/ML IJ SOLN: 0.6250 mg | Freq: Once | INTRAMUSCULAR | Status: DC | PRN

## 2023-10-16 MED ORDER — DEXAMETHASONE SODIUM PHOSPHATE 10 MG/ML IJ SOLN
INTRAMUSCULAR | Status: AC
  Filled 2023-10-16: qty 1

## 2023-10-16 MED ORDER — LIDOCAINE HCL (PF) 2 % IJ SOLN
INTRAMUSCULAR | Status: AC
  Filled 2023-10-16: qty 5

## 2023-10-16 MED ORDER — BUPIVACAINE HCL (PF) 0.25 % IJ SOLN
INTRAMUSCULAR | Status: AC
  Filled 2023-10-16: qty 30

## 2023-10-16 MED ORDER — DEXMEDETOMIDINE HCL IN NACL 80 MCG/20ML IV SOLN
INTRAVENOUS | Status: AC
  Filled 2023-10-16: qty 20

## 2023-10-16 MED ORDER — LACTATED RINGERS IV SOLN: INTRAVENOUS | Status: DC

## 2023-10-16 MED ORDER — PHENYLEPHRINE 80 MCG/ML (10ML) SYRINGE FOR IV PUSH (FOR BLOOD PRESSURE SUPPORT)
PREFILLED_SYRINGE | INTRAVENOUS | Status: AC
  Filled 2023-10-16: qty 10

## 2023-10-16 MED ORDER — PROPOFOL 10 MG/ML IV BOLUS
INTRAVENOUS | Status: DC | PRN
  Administered 2023-10-16: 200 mg via INTRAVENOUS
  Administered 2023-10-16: 150 ug/kg/min via INTRAVENOUS
  Administered 2023-10-16: 200 mg via INTRAVENOUS
  Administered 2023-10-16: 150 ug/kg/min via INTRAVENOUS

## 2023-10-16 MED ORDER — EPHEDRINE 5 MG/ML INJ
INTRAVENOUS | Status: AC
  Filled 2023-10-16: qty 5

## 2023-10-16 MED ORDER — FENTANYL CITRATE (PF) 100 MCG/2ML IJ SOLN
INTRAMUSCULAR | Status: AC
  Filled 2023-10-16: qty 2

## 2023-10-16 MED ORDER — MIDAZOLAM HCL 2 MG/2ML IJ SOLN
INTRAMUSCULAR | Status: AC
  Filled 2023-10-16: qty 2

## 2023-10-16 MED ORDER — OXYCODONE HCL 5 MG PO TABS: 5.0000 mg | ORAL_TABLET | Freq: Once | ORAL | Status: DC | PRN

## 2023-10-16 MED ORDER — CHLORHEXIDINE GLUCONATE 0.12 % MT SOLN
15.0000 mL | Freq: Once | OROMUCOSAL | Status: AC
  Administered 2023-10-16 (×2): 15 mL via OROMUCOSAL

## 2023-10-16 MED ORDER — PROPOFOL 1000 MG/100ML IV EMUL
INTRAVENOUS | Status: AC
  Filled 2023-10-16: qty 100

## 2023-10-16 MED ORDER — HYDROCODONE-ACETAMINOPHEN 5-325 MG PO TABS: 1.0000 | ORAL_TABLET | Freq: Four times a day (QID) | ORAL | 0 refills | Status: DC | PRN

## 2023-10-16 MED ORDER — BUPIVACAINE HCL (PF) 0.25 % IJ SOLN
INTRAMUSCULAR | Status: DC | PRN
Start: 2023-10-16 — End: 2023-10-16
  Administered 2023-10-16 (×2): 30 mL

## 2023-10-16 MED ORDER — FENTANYL CITRATE (PF) 100 MCG/2ML IJ SOLN
INTRAMUSCULAR | Status: DC | PRN
  Administered 2023-10-16 (×3): 25 ug via INTRAVENOUS
  Administered 2023-10-16: 50 ug via INTRAVENOUS
  Administered 2023-10-16: 25 ug via INTRAVENOUS
  Administered 2023-10-16: 50 ug via INTRAVENOUS

## 2023-10-16 MED ORDER — DEXMEDETOMIDINE HCL IN NACL 80 MCG/20ML IV SOLN
INTRAVENOUS | Status: DC | PRN
  Administered 2023-10-16 (×2): 8 ug via INTRAVENOUS

## 2023-10-16 MED ORDER — PROPOFOL 10 MG/ML IV BOLUS
INTRAVENOUS | Status: AC
  Filled 2023-10-16: qty 20

## 2023-10-16 MED ORDER — MIDAZOLAM HCL 5 MG/5ML IJ SOLN
INTRAMUSCULAR | Status: DC | PRN
  Administered 2023-10-16 (×2): 2 mg via INTRAVENOUS

## 2023-10-16 MED ORDER — LIDOCAINE HCL (CARDIAC) PF 100 MG/5ML IV SOSY
PREFILLED_SYRINGE | INTRAVENOUS | Status: DC | PRN
  Administered 2023-10-16 (×2): 80 mg via INTRAVENOUS

## 2023-10-16 MED ORDER — STERILE WATER FOR IRRIGATION IR SOLN
Status: DC | PRN
  Administered 2023-10-16 (×2): 1000 mL

## 2023-10-16 MED ORDER — HYDROMORPHONE HCL 1 MG/ML IJ SOLN: 0.2500 mg | INTRAMUSCULAR | Status: DC | PRN

## 2023-10-16 MED ORDER — 0.9 % SODIUM CHLORIDE (POUR BTL) OPTIME
TOPICAL | Status: DC | PRN
  Administered 2023-10-16 (×2): 1000 mL

## 2023-10-16 MED ORDER — CEFAZOLIN SODIUM-DEXTROSE 2-4 GM/100ML-% IV SOLN
2.0000 g | INTRAVENOUS | Status: AC
  Administered 2023-10-16 (×2): 2 g via INTRAVENOUS
  Filled 2023-10-16: qty 100

## 2023-10-16 MED ORDER — OXYCODONE HCL 5 MG/5ML PO SOLN: 5.0000 mg | Freq: Once | ORAL | Status: DC | PRN

## 2023-10-16 MED ORDER — DEXAMETHASONE SODIUM PHOSPHATE 10 MG/ML IJ SOLN
INTRAMUSCULAR | Status: DC | PRN
  Administered 2023-10-16 (×2): 8 mg via INTRAVENOUS

## 2023-10-16 MED ORDER — EPHEDRINE SULFATE-NACL 50-0.9 MG/10ML-% IV SOSY
PREFILLED_SYRINGE | INTRAVENOUS | Status: DC | PRN
  Administered 2023-10-16 (×2): 10 mg via INTRAVENOUS
  Administered 2023-10-16 (×2): 5 mg via INTRAVENOUS

## 2023-10-16 MED ORDER — ONDANSETRON HCL 4 MG/2ML IJ SOLN
INTRAMUSCULAR | Status: DC | PRN
  Administered 2023-10-16 (×2): 4 mg via INTRAVENOUS

## 2023-10-16 MED ORDER — PROPOFOL 500 MG/50ML IV EMUL
INTRAVENOUS | Status: AC
  Filled 2023-10-16: qty 50

## 2023-10-16 MED ORDER — HYDRALAZINE HCL 20 MG/ML IJ SOLN
INTRAMUSCULAR | Status: DC | PRN
  Administered 2023-10-16 (×2): 5 mg via INTRAVENOUS

## 2023-10-16 MED ORDER — FENTANYL CITRATE (PF) 100 MCG/2ML IJ SOLN
INTRAMUSCULAR | Status: AC
Start: 2023-10-16 — End: 2023-10-16
  Filled 2023-10-16: qty 2

## 2023-10-16 SURGICAL SUPPLY — 30 items
BLADE CLIPPER SENSICLIP SURGIC (BLADE) IMPLANT
BLADE SURG 15 STRL LF DISP TIS (BLADE) ×1 IMPLANT
BNDG GAUZE DERMACEA FLUFF 4 (GAUZE/BANDAGES/DRESSINGS) ×1 IMPLANT
CLEANER CAUTERY TIP PAD (MISCELLANEOUS) IMPLANT
COVER BACK TABLE 60X90IN (DRAPES) ×1 IMPLANT
COVER MAYO STAND STRL (DRAPES) ×1 IMPLANT
DERMABOND ADVANCED .7 DNX12 (GAUZE/BANDAGES/DRESSINGS) IMPLANT
DISSECTOR ROUND CHERRY 3/8 STR (MISCELLANEOUS) IMPLANT
DRAIN PENROSE 0.25X18 (DRAIN) IMPLANT
ELECT NDL TIP 2.8 STRL (NEEDLE) IMPLANT
ELECT NEEDLE TIP 2.8 STRL (NEEDLE) IMPLANT
ELECTRODE REM PT RTRN 9FT ADLT (ELECTROSURGICAL) IMPLANT
GAUZE 4X4 16PLY ~~LOC~~+RFID DBL (SPONGE) ×1 IMPLANT
GLOVE BIO SURGEON STRL SZ8 (GLOVE) ×1 IMPLANT
KIT TURNOVER CYSTO (KITS) ×1 IMPLANT
MANIFOLD NEPTUNE II (INSTRUMENTS) IMPLANT
NDL HYPO 25X1 1.5 SAFETY (NEEDLE) IMPLANT
NEEDLE HYPO 25X1 1.5 SAFETY (NEEDLE) IMPLANT
NS IRRIG 500ML POUR BTL (IV SOLUTION) ×1 IMPLANT
PENCIL SMOKE EVACUATOR (MISCELLANEOUS) ×1 IMPLANT
SLEEVE SCD COMPRESS KNEE MED (STOCKING) ×1 IMPLANT
SUPPORTER AHLETIC TETRA LG (SOFTGOODS) ×1 IMPLANT
SUT CHROMIC 3 0 SH 27 (SUTURE) ×2 IMPLANT
SUT CHROMIC 4 0 RB 1X27 (SUTURE) IMPLANT
SUT VIC AB 3-0 SH 27XBRD (SUTURE) ×1 IMPLANT
SYR CONTROL 10ML LL (SYRINGE) IMPLANT
TOWEL OR 17X24 6PK STRL BLUE (TOWEL DISPOSABLE) ×2 IMPLANT
TUBE CONNECTING 12X1/4 (SUCTIONS) ×1 IMPLANT
WATER STERILE IRR 500ML POUR (IV SOLUTION) ×1 IMPLANT
YANKAUER SUCT BULB TIP NO VENT (SUCTIONS) ×1 IMPLANT

## 2023-10-16 NOTE — Interval H&P Note (Signed)
 History and Physical Interval Note:  10/16/2023 2:09 PM  Earl Sanchez  has presented today for surgery, with the diagnosis of Spermatocele.  The various methods of treatment have been discussed with the patient and family. After consideration of risks, benefits and other options for treatment, the patient has consented to  Procedure(s): EXCISION, SPERMATOCELE (Right) as a surgical intervention.  The patient's history has been reviewed, patient examined, no change in status, stable for surgery.  I have reviewed the patient's chart and labs.  Questions were answered to the patient's satisfaction.     Adine Manly

## 2023-10-16 NOTE — Anesthesia Preprocedure Evaluation (Addendum)
 Anesthesia Evaluation  Patient identified by MRN, date of birth, ID band Patient awake    Reviewed: Allergy & Precautions, NPO status , Patient's Chart, lab work & pertinent test results  Airway Mallampati: I  TM Distance: >3 FB     Dental no notable dental hx. (+) Teeth Intact, Dental Advisory Given   Pulmonary neg pulmonary ROS   Pulmonary exam normal breath sounds clear to auscultation       Cardiovascular hypertension, Normal cardiovascular exam+ dysrhythmias Atrial Fibrillation  Rhythm:Regular Rate:Normal  S/P D/C Cardioversion 2014  EKG 09/30/23 Sinus bradycardia Minimal voltage criteria for LVH, may be normal variant ( Sokolow-Lyon ) Cannot rule out Anteroseptal infarct , age undetermined  Echo 03/13/12 LVEF 45-50%   Neuro/Psych    GI/Hepatic Neg liver ROS,,,  Endo/Other  negative endocrine ROS  HLD  Renal/GU negative Renal ROS     Musculoskeletal  (+) Arthritis , Osteoarthritis,  Hx/o ankylosing spondylitis   Abdominal   Peds  Hematology negative hematology ROS (+)   Anesthesia Other Findings   Reproductive/Obstetrics Right spermatocele ED                              Anesthesia Physical Anesthesia Plan  ASA: 2  Anesthesia Plan: General   Post-op Pain Management: Dilaudid  IV, Tylenol  PO (pre-op)* and Precedex    Induction: Intravenous  PONV Risk Score and Plan: 4 or greater and Treatment may vary due to age or medical condition, Midazolam , Ondansetron  and Dexamethasone   Airway Management Planned: LMA  Additional Equipment: None  Intra-op Plan:   Post-operative Plan: Extubation in OR  Informed Consent: I have reviewed the patients History and Physical, chart, labs and discussed the procedure including the risks, benefits and alternatives for the proposed anesthesia with the patient or authorized representative who has indicated his/her understanding and acceptance.      Dental advisory given  Plan Discussed with: CRNA and Anesthesiologist  Anesthesia Plan Comments:          Anesthesia Quick Evaluation

## 2023-10-16 NOTE — Transfer of Care (Signed)
 Immediate Anesthesia Transfer of Care Note  Patient: Earl Sanchez  Procedure(s) Performed: EXCISION, SPERMATOCELE (Right: Penis)  Patient Location: PACU  Anesthesia Type:General  Level of Consciousness: awake, alert , oriented, and patient cooperative  Airway & Oxygen Therapy: Patient Spontanous Breathing and Patient connected to face mask oxygen  Post-op Assessment: Report given to RN and Post -op Vital signs reviewed and stable  Post vital signs: Reviewed and stable  Last Vitals:  Vitals Value Taken Time  BP 143/83   Temp    Pulse 72 10/16/23 15:53  Resp 9 10/16/23 15:53  SpO2 100 % 10/16/23 15:53  Vitals shown include unfiled device data.  Last Pain:  Vitals:   10/16/23 1205  TempSrc:   PainSc: 0-No pain      Patients Stated Pain Goal: 5 (10/16/23 1205)  Complications: No notable events documented.

## 2023-10-16 NOTE — Anesthesia Procedure Notes (Signed)
 Procedure Name: LMA Insertion Date/Time: 10/16/2023 2:53 PM  Performed by: Nada Corean CROME, CRNAPre-anesthesia Checklist: Emergency Drugs available, Patient identified, Suction available, Patient being monitored and Timeout performed Patient Re-evaluated:Patient Re-evaluated prior to induction Oxygen Delivery Method: Circle system utilized Preoxygenation: Pre-oxygenation with 100% oxygen Induction Type: IV induction Ventilation: Mask ventilation without difficulty LMA: LMA inserted LMA Size: 4.0 Tube type: Oral Number of attempts: 1 Placement Confirmation: positive ETCO2 and breath sounds checked- equal and bilateral Tube secured with: Tape Dental Injury: Teeth and Oropharynx as per pre-operative assessment

## 2023-10-16 NOTE — Op Note (Signed)
 OPERATIVE NOTE   Patient Name: Earl Sanchez  MRN: [id number]   Date of Procedure: 10/16/23    Preoperative diagnosis:  Right spermatocele  Postoperative diagnosis:  Right spermatocele  Procedure:  Right spermatocelectomy  Attending: Adine DOROTHA Manly, MD  Anesthesia: General with local  Estimated blood loss: 25 ml  Fluids: Per anesthesia record  Drains: None  Specimens: Right spermatocele  Antibiotics: Ancef  2 gm IV  Findings:  Cystic structure posterior and inferior to the right testicle which appeared to be connected to the epididymal tail; normal testes and cord  Indications:  63 year old male presents for surgical management of a right spermatocele.  He has had a right scrotal mass for approximately 18 years.  This is gradually increased in size.  Due to the current size of the cystic mass he has some discomfort associated with certain positions and with sleeping.  Scrotal ultrasound from 4/25 showed a large septated cyst on the right measuring 4.8 x 6.1 x 2.8 cm in size, normal right testicle, and left testes with several hypoechoic areas.  MRI from 5/25 showed normal testes bilaterally with a large right epididymal cyst.  He presents now for a right spermatocelectomy.  The procedure including potential risks have been discussed with the patient in detail.  He understands and wishes to proceed as described.  Description of Procedure:  The patient was taken to the operating room suite and properly identified.  After successful induction of a general anesthetic, he was placed in the supine position.  He received IV Ancef .  The patient's genitalia was prepped and draped in sterile fashion.  A preoperative timeout was performed.  Examination demonstrated a soft cystic feeling mass inferior and posterior to the right testicle.  A midline incision was made along the median raphae.  The tunica vaginalis was opened and the right testicle and cystic mass were delivered into  the wound.  Examination demonstrated a normal-appearing right testicle.  The cystic structure was noted to be posterior and inferior to the right testicle.  The cystic mass was carefully dissected free from the testicle and cord.  Care was taken to avoid any injury to the cord structures.  Dissection was carried down to an apparent connection of the cystic mass to the epididymal tail.  This was ligated and divided using a 3-0 Vicryl suture.  Good hemostasis was obtained.  The specimen was passed off the table.  The right testicle was examined and appeared normal.  The testicle was placed back into the right hemiscrotum in its normal position.  The wound was irrigated with normal saline.  The dartos layer was closed with a running 3-0 chromic suture.  0.25% plain Marcaine  was injected into the wound.  A total of 6 mL was used for local anesthetic.  A 3-0 chromic suture was used to close the skin.  Dermabond was applied.  Fluffs and a scrotal support were placed.  The patient was then extubated and taken to the postanesthesia care unit in stable condition.  Complications: None  Condition: Stable, extubated, transferred to PACU  Plan:  Discharge to home Local care to wound

## 2023-10-16 NOTE — Anesthesia Postprocedure Evaluation (Signed)
 Anesthesia Post Note  Patient: Earl Sanchez  Procedure(s) Performed: EXCISION, SPERMATOCELE (Right: Penis)     Patient location during evaluation: PACU Anesthesia Type: General Level of consciousness: awake and alert and oriented Pain management: pain level controlled Vital Signs Assessment: post-procedure vital signs reviewed and stable Respiratory status: spontaneous breathing, nonlabored ventilation and respiratory function stable Cardiovascular status: blood pressure returned to baseline and stable Postop Assessment: no apparent nausea or vomiting Anesthetic complications: no   No notable events documented.  Last Vitals:  Vitals:   10/16/23 1600 10/16/23 1615  BP: 130/74 138/80  Pulse: 72 63  Resp: (!) 21 18  Temp:    SpO2: 98% 99%    Last Pain:  Vitals:   10/16/23 1615  TempSrc:   PainSc: 0-No pain                 Domonik Levario A.

## 2023-10-17 ENCOUNTER — Encounter (HOSPITAL_COMMUNITY): Payer: Self-pay | Admitting: Urology

## 2023-10-18 ENCOUNTER — Ambulatory Visit: Payer: Self-pay | Admitting: Urology

## 2023-10-18 LAB — SURGICAL PATHOLOGY

## 2023-11-14 ENCOUNTER — Encounter: Payer: Self-pay | Admitting: Urology

## 2023-11-14 ENCOUNTER — Ambulatory Visit: Admitting: Urology

## 2023-11-14 VITALS — BP 176/106 | HR 50 | Ht 68.0 in | Wt 160.0 lb

## 2023-11-14 DIAGNOSIS — N4340 Spermatocele of epididymis, unspecified: Secondary | ICD-10-CM

## 2023-11-14 NOTE — Progress Notes (Signed)
 Assessment: 1. Spermatocele, right     Plan: Return to office prn  Chief Complaint:  Chief Complaint  Patient presents with   Spermatocele    History of Present Illness:  Earl Sanchez is a 63 y.o. male who is seen for further evaluation of right scrotal mass.  He initially noted this approximately 18 years ago and was told he had a benign growth in his scrotum.  This area has gradually increased in size.  It is currently of a size that is uncomfortable for him with certain positions and with sleeping.  He does not have any pain associated with the mass.  No scrotal swelling or redness.  No history of scrotal trauma.  Scrotal U/S from 4/25 showed a large septated cyst on the right, 4.8 x 6.1 x 2.8 cm in size, normal right testis, and left testis with several hypoechoic areas. MRI from 5/25 showed normal testicles bilaterally, large right epididymal cyst.  He underwent a right spermatocelectomy on 10/16/2023.  Pathology consistent with benign spermatocele.  He returns today for follow-up.  He is doing very well.  No scrotal swelling or pain.  Incision is well-healed.   Portions of the above documentation were copied from a prior visit for review purposes only.   Past Medical History:  Past Medical History:  Diagnosis Date   Ankylosing spondylitis (HCC)    Dysrhythmia    Erectile dysfunction    Hypertension    Microscopic hematuria     Past Surgical History:  Past Surgical History:  Procedure Laterality Date   CARDIOVERSION  03/27/2012   Procedure: CARDIOVERSION;  Surgeon: Maude JAYSON Emmer, MD;  Location: [facility] ENDOSCOPY;  Service: Cardiovascular;  Laterality: N/A;   LYMPH GLAND EXCISION  1989   left neck/benign   SPERMATOCELECTOMY Right 10/16/2023   Procedure: EXCISION, SPERMATOCELE;  Surgeon: Roseann Adine PARAS., MD;  Location: [facility] ORS;  Service: Urology;  Laterality: Right;    Allergies:  No Known Allergies  Family History:  Family History  Problem Relation Age of  Onset   Arthritis Mother    Hypertension Mother    Colon polyps Brother    Hypertension Father    Cancer Father     Social History:  Social History   Tobacco Use   Smoking status: Never    Passive exposure: Never   Smokeless tobacco: Never  Vaping Use   Vaping status: Never Used  Substance Use Topics   Alcohol use: Yes    Alcohol/week: 14.0 standard drinks of alcohol    Types: 14 Cans of beer per week    Comment: per week   Drug use: No    ROS: Constitutional:  Negative for fever, chills, weight loss CV: Negative for chest pain, previous MI, hypertension Respiratory:  Negative for shortness of breath, wheezing, sleep apnea, frequent cough GI:  Negative for nausea, vomiting, bloody stool, GERD  Physical exam: BP (!) 176/106   Pulse (!) 50   Ht 5' 8 (1.727 m)   Wt 160 lb (72.6 kg)   BMI 24.33 kg/m  GENERAL APPEARANCE:  Well appearing, well developed, well nourished, NAD HEENT:  Atraumatic, normocephalic, oropharynx clear NECK:  Supple without lymphadenopathy or thyromegaly ABDOMEN:  Soft, non-tender, no masses EXTREMITIES:  Moves all extremities well, without clubbing, cyanosis, or edema NEUROLOGIC:  Alert and oriented x 3, normal gait, CN II-XII grossly intact MENTAL STATUS:  appropriate BACK:  Non-tender to palpation, No CVAT SKIN:  Warm, dry, and intact GU: Scrotum: Incision well-healed; no significant  swelling or erythema Testis: normal without masses bilateral Epididymis: normal   Results: None

## 2024-03-22 ENCOUNTER — Other Ambulatory Visit: Payer: Self-pay | Admitting: Family Medicine

## 2024-03-22 DIAGNOSIS — I10 Essential (primary) hypertension: Secondary | ICD-10-CM

## 2024-07-24 ENCOUNTER — Encounter: Payer: Self-pay | Admitting: Family Medicine
